# Patient Record
Sex: Male | Born: 1950 | Race: White | Hispanic: No | Marital: Single | Smoking: Current every day smoker
Health system: Southern US, Community
[De-identification: ages and names within clinical notes are randomized; demographics above are authoritative.]

## PROBLEM LIST (undated history)

## (undated) ENCOUNTER — Emergency Department: Discharge status: Absent without leave | Payer: Self-pay

## (undated) ENCOUNTER — Emergency Department: Payer: Self-pay

## (undated) DIAGNOSIS — K089 Disorder of teeth and supporting structures, unspecified: Secondary | ICD-10-CM

## (undated) DIAGNOSIS — F32A Depression, unspecified: Secondary | ICD-10-CM

## (undated) DIAGNOSIS — I10 Essential (primary) hypertension: Secondary | ICD-10-CM

## (undated) DIAGNOSIS — R339 Retention of urine, unspecified: Secondary | ICD-10-CM

## (undated) DIAGNOSIS — I639 Cerebral infarction, unspecified: Secondary | ICD-10-CM

## (undated) DIAGNOSIS — H6690 Otitis media, unspecified, unspecified ear: Secondary | ICD-10-CM

## (undated) DIAGNOSIS — M549 Dorsalgia, unspecified: Secondary | ICD-10-CM

## (undated) DIAGNOSIS — B009 Herpesviral infection, unspecified: Secondary | ICD-10-CM

## (undated) DIAGNOSIS — M5410 Radiculopathy, site unspecified: Secondary | ICD-10-CM

## (undated) DIAGNOSIS — F1021 Alcohol dependence, in remission: Secondary | ICD-10-CM

## (undated) DIAGNOSIS — K219 Gastro-esophageal reflux disease without esophagitis: Secondary | ICD-10-CM

## (undated) DIAGNOSIS — M4800 Spinal stenosis, site unspecified: Secondary | ICD-10-CM

## (undated) DIAGNOSIS — F329 Major depressive disorder, single episode, unspecified: Secondary | ICD-10-CM

## (undated) DIAGNOSIS — G8929 Other chronic pain: Secondary | ICD-10-CM

## (undated) HISTORY — DX: Spinal stenosis, site unspecified: M48.00

## (undated) HISTORY — DX: Gastro-esophageal reflux disease without esophagitis: K21.9

## (undated) HISTORY — DX: Major depressive disorder, single episode, unspecified: F32.9

## (undated) HISTORY — PX: OTHER SURGICAL HISTORY: SHX169

## (undated) HISTORY — DX: Dorsalgia, unspecified: M54.9

## (undated) HISTORY — DX: Retention of urine, unspecified: R33.9

## (undated) HISTORY — DX: Disorder of teeth and supporting structures, unspecified: K08.9

## (undated) HISTORY — DX: Alcohol dependence, in remission: F10.21

## (undated) HISTORY — DX: Other chronic pain: G89.29

## (undated) HISTORY — PX: VASECTOMY: SHX75

## (undated) HISTORY — DX: Herpesviral infection, unspecified: B00.9

## (undated) HISTORY — DX: Otitis media, unspecified, unspecified ear: H66.90

## (undated) HISTORY — DX: Radiculopathy, site unspecified: M54.10

## (undated) HISTORY — DX: Depression, unspecified: F32.A

---

## 2005-07-08 ENCOUNTER — Encounter: Payer: Self-pay | Admitting: Family Medicine

## 2006-09-22 LAB — CONVERTED CEMR LAB
ALT: 45 units/L
Albumin: 4.3 g/dL
CO2: 24 meq/L
Calcium: 8.9 mg/dL
Chloride: 105 meq/L
HDL: 43 mg/dL
LDL Cholesterol: 81 mg/dL
PSA: 0.06 ng/mL
Sodium: 142 meq/L
TSH: 2.6 microintl units/mL
Total Protein: 6.8 g/dL
Triglycerides: 195 mg/dL
WBC, blood: 6.3 10*3/uL

## 2007-02-10 ENCOUNTER — Ambulatory Visit: Payer: Self-pay | Admitting: Family Medicine

## 2007-02-10 DIAGNOSIS — M4800 Spinal stenosis, site unspecified: Secondary | ICD-10-CM

## 2007-02-10 DIAGNOSIS — F339 Major depressive disorder, recurrent, unspecified: Secondary | ICD-10-CM | POA: Insufficient documentation

## 2007-02-10 DIAGNOSIS — M47817 Spondylosis without myelopathy or radiculopathy, lumbosacral region: Secondary | ICD-10-CM

## 2007-02-10 DIAGNOSIS — R52 Pain, unspecified: Secondary | ICD-10-CM | POA: Insufficient documentation

## 2007-02-10 DIAGNOSIS — F329 Major depressive disorder, single episode, unspecified: Secondary | ICD-10-CM

## 2007-02-18 ENCOUNTER — Telehealth: Payer: Self-pay | Admitting: Family Medicine

## 2007-02-18 ENCOUNTER — Encounter: Payer: Self-pay | Admitting: Family Medicine

## 2007-04-07 ENCOUNTER — Encounter: Payer: Self-pay | Admitting: Family Medicine

## 2007-05-05 ENCOUNTER — Encounter: Payer: Self-pay | Admitting: Family Medicine

## 2007-05-25 ENCOUNTER — Encounter: Payer: Self-pay | Admitting: Family Medicine

## 2007-05-27 ENCOUNTER — Ambulatory Visit: Payer: Self-pay | Admitting: Family Medicine

## 2007-05-27 DIAGNOSIS — J310 Chronic rhinitis: Secondary | ICD-10-CM | POA: Insufficient documentation

## 2007-05-27 DIAGNOSIS — R609 Edema, unspecified: Secondary | ICD-10-CM | POA: Insufficient documentation

## 2007-05-27 LAB — CONVERTED CEMR LAB
Blood in Urine, dipstick: NEGATIVE
Nitrite: NEGATIVE
Specific Gravity, Urine: 1.02
WBC Urine, dipstick: NEGATIVE

## 2007-05-28 LAB — CONVERTED CEMR LAB
ALT: 18 units/L (ref 0–53)
AST: 25 units/L (ref 0–37)
Alkaline Phosphatase: 67 units/L (ref 39–117)
Basophils Absolute: 0 10*3/uL (ref 0.0–0.1)
Basophils Relative: 1 % (ref 0–1)
Chloride: 104 meq/L (ref 96–112)
Creatinine, Ser: 0.73 mg/dL (ref 0.40–1.50)
Eosinophils Relative: 1 % (ref 0–5)
Hemoglobin: 14 g/dL (ref 13.0–17.0)
MCHC: 32.9 g/dL (ref 30.0–36.0)
Monocytes Absolute: 0.5 10*3/uL (ref 0.1–1.0)
Neutro Abs: 3.1 10*3/uL (ref 1.7–7.7)
RDW: 13.9 % (ref 11.5–15.5)
Total Bilirubin: 0.4 mg/dL (ref 0.3–1.2)

## 2007-06-22 ENCOUNTER — Encounter: Payer: Self-pay | Admitting: Family Medicine

## 2007-06-23 ENCOUNTER — Encounter: Payer: Self-pay | Admitting: Family Medicine

## 2007-06-28 ENCOUNTER — Ambulatory Visit: Payer: Self-pay | Admitting: Family Medicine

## 2007-06-28 DIAGNOSIS — N401 Enlarged prostate with lower urinary tract symptoms: Secondary | ICD-10-CM

## 2007-06-29 ENCOUNTER — Encounter: Payer: Self-pay | Admitting: Family Medicine

## 2007-07-01 ENCOUNTER — Encounter: Payer: Self-pay | Admitting: Family Medicine

## 2007-07-26 ENCOUNTER — Encounter: Payer: Self-pay | Admitting: Family Medicine

## 2007-08-06 ENCOUNTER — Encounter: Payer: Self-pay | Admitting: Family Medicine

## 2007-09-16 ENCOUNTER — Ambulatory Visit: Payer: Self-pay | Admitting: Family Medicine

## 2007-09-16 DIAGNOSIS — K5900 Constipation, unspecified: Secondary | ICD-10-CM | POA: Insufficient documentation

## 2007-09-17 ENCOUNTER — Encounter: Payer: Self-pay | Admitting: Family Medicine

## 2007-09-17 DIAGNOSIS — M961 Postlaminectomy syndrome, not elsewhere classified: Secondary | ICD-10-CM

## 2007-09-22 ENCOUNTER — Telehealth (INDEPENDENT_AMBULATORY_CARE_PROVIDER_SITE_OTHER): Payer: Self-pay | Admitting: *Deleted

## 2007-10-07 ENCOUNTER — Ambulatory Visit: Payer: Self-pay | Admitting: Family Medicine

## 2007-10-07 DIAGNOSIS — R0602 Shortness of breath: Secondary | ICD-10-CM | POA: Insufficient documentation

## 2007-12-16 ENCOUNTER — Ambulatory Visit: Payer: Self-pay | Admitting: Family Medicine

## 2007-12-16 DIAGNOSIS — F17200 Nicotine dependence, unspecified, uncomplicated: Secondary | ICD-10-CM | POA: Insufficient documentation

## 2007-12-16 DIAGNOSIS — E785 Hyperlipidemia, unspecified: Secondary | ICD-10-CM | POA: Insufficient documentation

## 2007-12-16 LAB — CONVERTED CEMR LAB
Blood in Urine, dipstick: NEGATIVE
Nitrite: NEGATIVE
Urobilinogen, UA: 0.2
WBC Urine, dipstick: NEGATIVE

## 2007-12-17 ENCOUNTER — Encounter: Payer: Self-pay | Admitting: Family Medicine

## 2007-12-17 LAB — CONVERTED CEMR LAB
Albumin: 3.9 g/dL (ref 3.5–5.2)
BUN: 7 mg/dL (ref 6–23)
CO2: 25 meq/L (ref 19–32)
Calcium: 8.8 mg/dL (ref 8.4–10.5)
Chloride: 104 meq/L (ref 96–112)
Cholesterol: 167 mg/dL (ref 0–200)
Creatinine, Ser: 0.94 mg/dL (ref 0.40–1.50)
HDL: 46 mg/dL (ref >39)
PSA: 0.09 ng/mL — ABNORMAL LOW (ref 0.10–4.00)
Total CHOL/HDL Ratio: 3.6

## 2007-12-20 LAB — HM COLONOSCOPY

## 2007-12-27 ENCOUNTER — Encounter: Payer: Self-pay | Admitting: Family Medicine

## 2007-12-28 ENCOUNTER — Telehealth: Payer: Self-pay | Admitting: Family Medicine

## 2008-01-24 ENCOUNTER — Encounter: Payer: Self-pay | Admitting: Family Medicine

## 2008-02-24 ENCOUNTER — Ambulatory Visit: Payer: Self-pay | Admitting: Family Medicine

## 2008-02-28 ENCOUNTER — Telehealth: Payer: Self-pay | Admitting: Family Medicine

## 2008-04-13 ENCOUNTER — Ambulatory Visit: Payer: Self-pay | Admitting: Family Medicine

## 2008-04-13 DIAGNOSIS — L8991 Pressure ulcer of unspecified site, stage 1: Secondary | ICD-10-CM | POA: Insufficient documentation

## 2008-04-14 LAB — CONVERTED CEMR LAB
BUN: 8 mg/dL (ref 6–23)
Chloride: 101 meq/L (ref 96–112)
Glucose, Bld: 86 mg/dL (ref 70–99)
Potassium: 5 meq/L (ref 3.5–5.3)
Sodium: 138 meq/L (ref 135–145)

## 2008-04-19 ENCOUNTER — Telehealth: Payer: Self-pay | Admitting: Family Medicine

## 2008-04-24 ENCOUNTER — Telehealth (INDEPENDENT_AMBULATORY_CARE_PROVIDER_SITE_OTHER): Payer: Self-pay | Admitting: *Deleted

## 2008-04-29 ENCOUNTER — Encounter: Admission: RE | Admit: 2008-04-29 | Discharge: 2008-04-29 | Payer: Self-pay | Admitting: Family Medicine

## 2008-05-02 ENCOUNTER — Telehealth: Payer: Self-pay | Admitting: Family Medicine

## 2008-05-08 ENCOUNTER — Telehealth: Payer: Self-pay | Admitting: Family Medicine

## 2008-06-01 ENCOUNTER — Encounter: Payer: Self-pay | Admitting: Family Medicine

## 2008-06-12 ENCOUNTER — Ambulatory Visit: Payer: Self-pay | Admitting: Family Medicine

## 2008-06-19 ENCOUNTER — Encounter: Payer: Self-pay | Admitting: Family Medicine

## 2008-06-21 LAB — CONVERTED CEMR LAB
CO2: 23 meq/L (ref 19–32)
Calcium: 8.6 mg/dL (ref 8.4–10.5)
Chloride: 106 meq/L (ref 96–112)
Glucose, Bld: 94 mg/dL (ref 70–99)
HDL: 41 mg/dL (ref >39)
LDL Cholesterol: 137 mg/dL — ABNORMAL HIGH (ref 0–99)
Sodium: 141 meq/L (ref 135–145)
Total CHOL/HDL Ratio: 5.3
VLDL: 41 mg/dL — ABNORMAL HIGH (ref 0–40)

## 2008-08-31 ENCOUNTER — Ambulatory Visit: Payer: Self-pay | Admitting: Family Medicine

## 2008-12-08 ENCOUNTER — Ambulatory Visit: Payer: Self-pay | Admitting: Family Medicine

## 2008-12-19 ENCOUNTER — Ambulatory Visit: Payer: Self-pay | Admitting: Family Medicine

## 2009-02-01 ENCOUNTER — Telehealth (INDEPENDENT_AMBULATORY_CARE_PROVIDER_SITE_OTHER): Payer: Self-pay | Admitting: *Deleted

## 2009-05-08 ENCOUNTER — Telehealth: Payer: Self-pay | Admitting: Family Medicine

## 2010-02-11 ENCOUNTER — Ambulatory Visit: Payer: Self-pay | Admitting: Family Medicine

## 2010-03-21 ENCOUNTER — Telehealth (INDEPENDENT_AMBULATORY_CARE_PROVIDER_SITE_OTHER): Payer: Self-pay | Admitting: *Deleted

## 2010-04-16 NOTE — Letter (Signed)
Summary: Medical Examination for [facility]  Medical Examination for DMV   Imported By: Maryln Gottron 02/22/2010 13:24:24  _____________________________________________________________________  External Attachment:    Type:   Image     Comment:   External Document

## 2010-04-16 NOTE — Progress Notes (Signed)
Summary: Disability papers and gabapentin refills  Phone Note Refill Request   Refills Requested: Medication #1:  GABAPENTIN 600 MG  TABS take two tabs by mouth every eight hours as needed Pt also states that he is having a friend drop off papers for his disability.   Initial call taken by: Payton Spark CMA,  May 08, 2009 11:52 AM  Follow-up for Phone Call        I do not do long term disability. Pls let him know this. Follow-up by: Seymour Bars DO,  May 08, 2009 12:01 PM    Prescriptions: GABAPENTIN 600 MG  TABS (GABAPENTIN) take two tabs by mouth every eight hours as needed  #90 Tablet x 1   Entered and Authorized by:   Seymour Bars DO   Signed by:   Seymour Bars DO on 05/08/2009   Method used:   Electronically to        [facility]  [address] (retail)       [address]       [place], [place]  [address]       Ph: [phone]       Fax: [phone]   RxID:   [id number]   Appended Document: Disability papers and gabapentin refills Pt aware of the above. Pt states the papers are the renewal of the papers that you filled out in Aug.   Appended Document: Disability papers and gabapentin refills OK, will do the same.  Seymour Bars, D.O.
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
## 2010-04-18 NOTE — Progress Notes (Signed)
Summary: Omnaris refill       New/Updated Medications: OMNARIS 50 MCG/ACT SUSP (CICLESONIDE) 2 sprays each nostril daily Prescriptions: OMNARIS 50 MCG/ACT SUSP (CICLESONIDE) 2 sprays each nostril daily  #1 x 2   Entered by:   Payton Spark CMA   Authorized by:   Seymour Bars DO   Signed by:   Payton Spark CMA on 03/21/2010   Method used:   Electronically to        [facility]  [address] (retail)       [address]       [place], [place]  [address]       Ph: [phone]       Fax: [phone]   RxID:   [id number]

## 2010-06-03 ENCOUNTER — Telehealth: Payer: Self-pay | Admitting: Family Medicine

## 2010-06-04 ENCOUNTER — Encounter: Payer: Self-pay | Admitting: Family Medicine

## 2010-06-10 ENCOUNTER — Ambulatory Visit (INDEPENDENT_AMBULATORY_CARE_PROVIDER_SITE_OTHER): Payer: Medicare Other | Admitting: Family Medicine

## 2010-06-10 ENCOUNTER — Encounter: Payer: Self-pay | Admitting: Family Medicine

## 2010-06-10 DIAGNOSIS — L089 Local infection of the skin and subcutaneous tissue, unspecified: Secondary | ICD-10-CM | POA: Insufficient documentation

## 2010-06-10 DIAGNOSIS — E785 Hyperlipidemia, unspecified: Secondary | ICD-10-CM

## 2010-06-10 DIAGNOSIS — N400 Enlarged prostate without lower urinary tract symptoms: Secondary | ICD-10-CM

## 2010-06-10 DIAGNOSIS — Z1329 Encounter for screening for other suspected endocrine disorder: Secondary | ICD-10-CM

## 2010-06-10 DIAGNOSIS — IMO0002 Reserved for concepts with insufficient information to code with codable children: Secondary | ICD-10-CM

## 2010-06-10 DIAGNOSIS — R5383 Other fatigue: Secondary | ICD-10-CM

## 2010-06-10 DIAGNOSIS — K591 Functional diarrhea: Secondary | ICD-10-CM

## 2010-06-10 DIAGNOSIS — N401 Enlarged prostate with lower urinary tract symptoms: Secondary | ICD-10-CM

## 2010-06-10 MED ORDER — DIPHENOXYLATE-ATROPINE 2.5-0.025 MG PO TABS: 1.0000 | ORAL_TABLET | Freq: Four times a day (QID) | ORAL | Status: DC | PRN

## 2010-06-10 MED ORDER — CEPHALEXIN 500 MG PO CAPS: 500.0000 mg | ORAL_CAPSULE | Freq: Three times a day (TID) | ORAL | Status: AC

## 2010-06-10 NOTE — Progress Notes (Signed)
  Subjective:    Patient ID: John Scott, male    DOB: [birth year], 60 y.o.   MRN: [id number]  HPI 60 yo WM presents for f/u visit.  He is doing fairly well, seeing Dr Oneal Grout for chronic back pain.  Due for fasting labs.  [facility] and [facility] he will have his [facility]work done there.  He plans to request  A visit with a podiatrist thru the [place] for a toenails that he picked the distal margin off of today.  It is oozing blood but not painful.  Denies seeing any pus.  His toenail was thickened for years.  He requested an RX for Lomotil over the phone but I did not have this on his med list.  Apparently, with dietary changes, he has diarrhea from time to time and immodium is not enough.  Denies having constipation from use of his narcotics.  His last RX for 30 tabs lasted for about 6 mos.    Review of Systems  Constitutional: Negative for fever and fatigue.  Respiratory: Negative for cough and shortness of breath.   Cardiovascular: Positive for leg swelling. Negative for chest pain and palpitations.  Gastrointestinal: Negative for constipation.  Genitourinary: Negative for difficulty urinating.  Musculoskeletal: Positive for myalgias, back pain and gait problem.  Psychiatric/Behavioral: Positive for dysphoric mood.   BP 136/87  Pulse 77  Ht 6\' 2"  (1.88 m)  Wt 264 lb (119.75 kg)  BMI 33.90 kg/m2  SpO2 94%       Objective:   Physical Exam  Constitutional: He appears well-developed and well-nourished. No distress.  HENT:  Head: Normocephalic and atraumatic.  Cardiovascular: Normal rate, regular rhythm and normal heart sounds.   Pulmonary/Chest: Effort normal and breath sounds normal. No respiratory distress. He has no wheezes. He has no rales.  Skin:     Psychiatric: He has a normal mood and affect.          Assessment & Plan:

## 2010-06-10 NOTE — Patient Instructions (Addendum)
Soak big toe in warm soapy water once daily x 10 min and cover with polysporin ointment and gauze. F/U with podiatry through the [place].  Tetanus done in 09.  Take 7 days of Keflex and call if you develop increased pain, fever or pus.  Update fasting labs. Will call you w/ results.  Return for follow up in 4 mos.

## 2010-06-10 NOTE — Assessment & Plan Note (Signed)
Stable, followed by Dr Oneal Grout

## 2010-06-10 NOTE — Assessment & Plan Note (Signed)
Tetanus vaccine UTD. Wound care with warm water/ soap soaks, antibiotic ointment and a gauze dressing - change daily. Wound dressed today. Cover with 7 days of Kelfex and call if any sign of further infection. [facility] and will call me if any delays.

## 2010-06-13 NOTE — Progress Notes (Signed)
Summary: Requests Lomotil Rx  Phone Note Call from Patient   Caller: Patient Summary of Call: Pt requests refill on Lomotil for diarrhea and stomach cramps. Please advise. Initial call taken by: Payton Spark CMA,  June 03, 2010 4:35 PM  Follow-up for Phone Call        RX never prescribed here. He can call GI if that's where he got the last RX. Follow-up by: Seymour Bars DO,  June 03, 2010 4:54 PM     Appended Document: Requests Lomotil Rx Pt scheduled apt.

## 2010-08-06 ENCOUNTER — Encounter: Payer: Self-pay | Admitting: Family Medicine

## 2010-08-07 ENCOUNTER — Other Ambulatory Visit: Payer: Self-pay | Admitting: Family Medicine

## 2010-08-21 ENCOUNTER — Other Ambulatory Visit: Payer: Self-pay | Admitting: Family Medicine

## 2010-09-25 ENCOUNTER — Telehealth: Payer: Self-pay | Admitting: *Deleted

## 2010-09-25 NOTE — Telephone Encounter (Signed)
Pt states he needs a letter to get out of jury duty bc he is physically unable to go and sit. Please advise,

## 2010-09-27 NOTE — Telephone Encounter (Signed)
LMOM for the pt instructing him that Dr. Cathey Endow will not write a letter for him to get out of jury duty since he feels he is physically unable is not valid reason for not doing jury duty. Jarvis Newcomer, LPN [place]

## 2010-09-27 NOTE — Telephone Encounter (Signed)
This is not a valid reason to get out of jury duty.

## 2010-09-30 ENCOUNTER — Ambulatory Visit (INDEPENDENT_AMBULATORY_CARE_PROVIDER_SITE_OTHER): Payer: Medicare Other | Admitting: Family Medicine

## 2010-09-30 ENCOUNTER — Encounter: Payer: Self-pay | Admitting: Family Medicine

## 2010-09-30 NOTE — Progress Notes (Signed)
  Subjective:    Patient ID: John Scott, male    DOB: 10-Dec-1950, 60 y.o.   MRN: [id number]  HPI  I declined to see the pt today as Dr. Cathey Endow had already denied him getting out of jury duty. He is welcome to schedule an appt with her to discuss.   Review of Systems     Objective:   Physical Exam        Assessment & Plan:

## 2010-10-08 ENCOUNTER — Ambulatory Visit: Payer: Medicare Other | Admitting: Family Medicine

## 2010-10-21 ENCOUNTER — Other Ambulatory Visit: Payer: Self-pay | Admitting: Family Medicine

## 2011-02-14 ENCOUNTER — Other Ambulatory Visit: Payer: Self-pay | Admitting: *Deleted

## 2011-02-14 MED ORDER — DIPHENOXYLATE-ATROPINE 2.5-0.025 MG PO TABS: 1.0000 | ORAL_TABLET | Freq: Four times a day (QID) | ORAL | Status: AC | PRN

## 2011-04-22 ENCOUNTER — Other Ambulatory Visit: Payer: Self-pay | Admitting: *Deleted

## 2011-04-22 MED ORDER — FLUTICASONE PROPIONATE 50 MCG/ACT NA SUSP: 2.0000 | Freq: Every day | NASAL | Status: DC

## 2011-06-03 DIAGNOSIS — M47817 Spondylosis without myelopathy or radiculopathy, lumbosacral region: Secondary | ICD-10-CM | POA: Diagnosis not present

## 2011-06-03 DIAGNOSIS — M5137 Other intervertebral disc degeneration, lumbosacral region: Secondary | ICD-10-CM | POA: Diagnosis not present

## 2011-06-05 ENCOUNTER — Ambulatory Visit (INDEPENDENT_AMBULATORY_CARE_PROVIDER_SITE_OTHER): Payer: Medicare Other | Admitting: Family Medicine

## 2011-06-05 ENCOUNTER — Ambulatory Visit: Payer: Medicare Other | Admitting: Family Medicine

## 2011-06-05 ENCOUNTER — Ambulatory Visit
Admission: RE | Admit: 2011-06-05 | Discharge: 2011-06-05 | Disposition: A | Discharge status: Home / Self Care | Payer: Medicare Other | Source: Ambulatory Visit | Attending: Family Medicine | Admitting: Family Medicine

## 2011-06-05 ENCOUNTER — Encounter: Payer: Self-pay | Admitting: Family Medicine

## 2011-06-05 VITALS — BP 93/59 | HR 70 | Ht 74.0 in | Wt 273.0 lb

## 2011-06-05 DIAGNOSIS — M549 Dorsalgia, unspecified: Secondary | ICD-10-CM

## 2011-06-05 DIAGNOSIS — E785 Hyperlipidemia, unspecified: Secondary | ICD-10-CM | POA: Diagnosis not present

## 2011-06-05 DIAGNOSIS — R05 Cough: Secondary | ICD-10-CM

## 2011-06-05 MED ORDER — AMBULATORY NON FORMULARY MEDICATION: Status: DC

## 2011-06-05 MED ORDER — DIAZEPAM 10 MG PO TABS: 10.0000 mg | ORAL_TABLET | Freq: Three times a day (TID) | ORAL | Status: DC | PRN

## 2011-06-05 NOTE — Patient Instructions (Signed)
[facility] about getting [facility].
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
## 2011-06-12 LAB — COMPLETE METABOLIC PANEL WITH GFR
ALT: 14 U/L (ref 0–53)
AST: 17 U/L (ref 0–37)
Alkaline Phosphatase: 90 U/L (ref 39–117)
CO2: 27 mEq/L (ref 19–32)
Creat: 0.98 mg/dL (ref 0.50–1.35)
Sodium: 138 mEq/L (ref 135–145)
Total Bilirubin: 0.3 mg/dL (ref 0.3–1.2)
Total Protein: 6.6 g/dL (ref 6.0–8.3)

## 2011-06-12 LAB — LIPID PANEL
HDL: 39 mg/dL — ABNORMAL LOW (ref >39)
Total CHOL/HDL Ratio: 6.1 Ratio
Triglycerides: 453 mg/dL — ABNORMAL HIGH (ref <150)

## 2011-07-22 ENCOUNTER — Other Ambulatory Visit: Payer: Self-pay | Admitting: Family Medicine

## 2011-07-30 DIAGNOSIS — M790 Rheumatism, unspecified: Secondary | ICD-10-CM | POA: Diagnosis not present

## 2011-07-30 DIAGNOSIS — M797 Fibromyalgia: Secondary | ICD-10-CM | POA: Diagnosis not present

## 2011-07-30 DIAGNOSIS — IMO0002 Reserved for concepts with insufficient information to code with codable children: Secondary | ICD-10-CM | POA: Diagnosis not present

## 2011-07-30 DIAGNOSIS — M5137 Other intervertebral disc degeneration, lumbosacral region: Secondary | ICD-10-CM | POA: Diagnosis not present

## 2011-07-30 DIAGNOSIS — Z79899 Other long term (current) drug therapy: Secondary | ICD-10-CM | POA: Diagnosis not present

## 2011-07-30 DIAGNOSIS — M961 Postlaminectomy syndrome, not elsewhere classified: Secondary | ICD-10-CM | POA: Diagnosis not present

## 2011-08-12 ENCOUNTER — Other Ambulatory Visit: Payer: Self-pay | Admitting: *Deleted

## 2011-08-12 MED ORDER — FLUTICASONE PROPIONATE 50 MCG/ACT NA SUSP: 2.0000 | Freq: Every day | NASAL | Status: DC

## 2011-08-19 ENCOUNTER — Other Ambulatory Visit: Payer: Self-pay | Admitting: Family Medicine

## 2011-09-11 ENCOUNTER — Other Ambulatory Visit: Payer: Self-pay | Admitting: Family Medicine

## 2011-09-30 DIAGNOSIS — M790 Rheumatism, unspecified: Secondary | ICD-10-CM | POA: Diagnosis not present

## 2011-10-20 ENCOUNTER — Encounter: Payer: Self-pay | Admitting: Family Medicine

## 2011-10-20 ENCOUNTER — Ambulatory Visit (INDEPENDENT_AMBULATORY_CARE_PROVIDER_SITE_OTHER): Payer: Medicare Other | Admitting: Family Medicine

## 2011-10-20 VITALS — BP 108/73 | HR 55 | Wt 267.0 lb

## 2011-10-20 DIAGNOSIS — R0981 Nasal congestion: Secondary | ICD-10-CM | POA: Insufficient documentation

## 2011-10-20 DIAGNOSIS — M461 Sacroiliitis, not elsewhere classified: Secondary | ICD-10-CM | POA: Diagnosis not present

## 2011-10-20 DIAGNOSIS — M129 Arthropathy, unspecified: Secondary | ICD-10-CM | POA: Diagnosis not present

## 2011-10-20 DIAGNOSIS — IMO0001 Reserved for inherently not codable concepts without codable children: Secondary | ICD-10-CM | POA: Diagnosis not present

## 2011-10-20 DIAGNOSIS — M5137 Other intervertebral disc degeneration, lumbosacral region: Secondary | ICD-10-CM | POA: Diagnosis not present

## 2011-10-20 DIAGNOSIS — M47817 Spondylosis without myelopathy or radiculopathy, lumbosacral region: Secondary | ICD-10-CM | POA: Diagnosis not present

## 2011-10-20 DIAGNOSIS — L0390 Cellulitis, unspecified: Secondary | ICD-10-CM

## 2011-10-20 DIAGNOSIS — L0291 Cutaneous abscess, unspecified: Secondary | ICD-10-CM

## 2011-10-20 DIAGNOSIS — M961 Postlaminectomy syndrome, not elsewhere classified: Secondary | ICD-10-CM | POA: Diagnosis not present

## 2011-10-20 DIAGNOSIS — J3489 Other specified disorders of nose and nasal sinuses: Secondary | ICD-10-CM

## 2011-10-20 DIAGNOSIS — IMO0002 Reserved for concepts with insufficient information to code with codable children: Secondary | ICD-10-CM

## 2011-10-20 DIAGNOSIS — L739 Follicular disorder, unspecified: Secondary | ICD-10-CM | POA: Insufficient documentation

## 2011-10-20 MED ORDER — CHLORPHENIRAMINE-PSEUDOEPH 4-60 MG PO TABS: 1.0000 | ORAL_TABLET | Freq: Two times a day (BID) | ORAL | Status: DC | PRN

## 2011-10-20 NOTE — Patient Instructions (Addendum)
Incision and Drainage of Abscess An abscess (boil or furuncle) is an area infected by germs that contains a collection of pus. Signs and problems (symptoms) of an abscess include pain, tenderness, redness, or hardness. You may feel a moveable, soft area under your skin. An abscess can occur anywhere in the body. Occasionally, this may spread to surrounding tissues causing cellulitis. Sometimes, a surgeon may make a cut (incision) over your abscess. The pus is drained. Gauze may be packed into the space to provide a drain. Keeping a drain or piece of gauze in the incision keeps the skin from healing first. This helps stop the abscess from forming again. The area may be painful for 5 to 7 days. Most people with an abscess do not have high fevers. If seen early, your abscess may not have localized and may not be cut. If it does not get better on its own or with medicines, you may require another appointment. HOME CARE INSTRUCTIONS   Use a warm-moist compress applied to the site of the incised boil four times a day for five days, this will encourage drainage and should prevent the reoccurrence of a boil.  Return ASAP if you develop fevers, chills, or if the boil appears to have reappeared.   Only take over-the-counter or prescription medicines for pain, discomfort, or fever as directed by your caregiver. Use these only if your caregiver has not given medicines that would interfere.   When you bathe, remove the gauze drain after soaking. You may then wash the wound gently with mild, soapy water.   See your caregiver as directed for a recheck if not improving.  If antibiotics were prescribed, take them as directed.  SEEK MEDICAL CARE IF:   You develop increased pain, swelling, redness, drainage, or bleeding in the wound site.   You develop signs of generalized infection, including muscle aches, chills, or a general ill feeling.   You or your child has an oral temperature above 102 F (38.9 C).  MAKE  SURE YOU:   Understand these instructions.   Will watch your condition.   Will get help right away if you are not doing well or get worse.  Document Released: 08/27/2000 Document Revised: 11/13/2010 Document Reviewed: 10/22/2007 [facility] Patient Information 2012 [place], [place].
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
## 2011-10-21 ENCOUNTER — Ambulatory Visit: Payer: Medicare Other | Admitting: Sports Medicine

## 2011-10-21 ENCOUNTER — Telehealth: Payer: Self-pay | Admitting: Family Medicine

## 2011-10-21 MED ORDER — PSEUDOEPHEDRINE HCL 60 MG PO TABS: ORAL_TABLET | ORAL | Status: DC

## 2011-10-21 MED ORDER — CHLORPHENIRAMINE MALEATE 4 MG PO TABS: ORAL_TABLET | ORAL | Status: DC

## 2011-10-21 NOTE — Telephone Encounter (Signed)
Received fax from [facility], unable to fill chlrpheniramine-pseudophedrine 4-60mg  but able to fill individual medications at same doses.  Updated med list and Rx printed and given to [facility] for faxing to pharmacy.

## 2011-10-29 ENCOUNTER — Encounter: Payer: Self-pay | Admitting: Family Medicine

## 2011-10-29 ENCOUNTER — Ambulatory Visit (INDEPENDENT_AMBULATORY_CARE_PROVIDER_SITE_OTHER): Payer: Medicare Other | Admitting: Family Medicine

## 2011-10-29 VITALS — BP 139/83 | HR 95 | Temp 97.4°F | Wt 263.0 lb

## 2011-10-29 DIAGNOSIS — H60399 Other infective otitis externa, unspecified ear: Secondary | ICD-10-CM

## 2011-10-29 DIAGNOSIS — H6091 Unspecified otitis externa, right ear: Secondary | ICD-10-CM

## 2011-10-29 MED ORDER — CIPROFLOXACIN-DEXAMETHASONE 0.3-0.1 % OT SUSP: 4.0000 [drp] | Freq: Two times a day (BID) | OTIC | Status: AC
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
## 2011-10-29 NOTE — Patient Instructions (Signed)
Otitis Externa  Otitis externa ("swimmer's ear") is a germ (bacterial) or fungal infection of the outer ear canal (from the eardrum to the outside of the ear). Swimming in dirty water may cause swimmer's ear. It also may be caused by moisture in the ear from water remaining after swimming or bathing. Often the first signs of infection may be itching in the ear canal. This may progress to ear canal swelling, redness, and pus drainage, which may be signs of infection.  HOME CARE INSTRUCTIONS    Apply the antibiotic drops to the ear canal as prescribed by your doctor.   This can be a very painful medical condition. A strong pain reliever may be prescribed.   Only take over-the-counter or prescription medicines for pain, discomfort, or fever as directed by your caregiver.   If your caregiver has given you a follow-up appointment, it is very important to keep that appointment. Not keeping the appointment could result in a chronic or permanent injury, pain, hearing loss and disability. If there is any problem keeping the appointment, you must call back to this facility for assistance.  PREVENTION    It is important to keep your ear dry. Use the corner of a towel to wick water out of the ear canal after swimming or bathing.   Avoid scratching in your ear. This can damage the ear canal or remove the protective wax lining the canal and make it easier for germs (bacteria) or a fungus to grow.   You may use ear drops made of rubbing alcohol and vinegar after swimming to prevent future "swimmer's ear" infections. Make up a small bottle of equal parts white vinegar and alcohol. Put 3 or 4 drops into each ear after swimming.   Avoid swimming in lakes, polluted water, or poorly chlorinated pools.  SEEK MEDICAL CARE IF:    An oral temperature above 102 F (38.9 C) develops.   Your ear is still painful after 3 days and shows signs of getting worse (redness, swelling, pain, or pus).  MAKE SURE YOU:    Understand these  instructions.   Will watch your condition.   Will get help right away if you are not doing well or get worse.  Document Released: 03/03/2005 Document Revised: 02/20/2011 Document Reviewed: 10/08/2007  [employer] Patient Information 2012 [employer].

## 2011-11-01 ENCOUNTER — Emergency Department (INDEPENDENT_AMBULATORY_CARE_PROVIDER_SITE_OTHER)
Admission: EM | Admit: 2011-11-01 | Discharge: 2011-11-01 | Disposition: A | Discharge status: Home / Self Care | Payer: Medicare Other | Source: Home / Self Care | Attending: Family Medicine | Admitting: Family Medicine

## 2011-11-01 DIAGNOSIS — H9203 Otalgia, bilateral: Secondary | ICD-10-CM

## 2011-11-01 DIAGNOSIS — M2669 Other specified disorders of temporomandibular joint: Secondary | ICD-10-CM

## 2011-11-01 DIAGNOSIS — H6690 Otitis media, unspecified, unspecified ear: Secondary | ICD-10-CM

## 2011-11-01 DIAGNOSIS — M26629 Arthralgia of temporomandibular joint, unspecified side: Secondary | ICD-10-CM

## 2011-11-01 DIAGNOSIS — H9209 Otalgia, unspecified ear: Secondary | ICD-10-CM

## 2011-11-01 DIAGNOSIS — H6692 Otitis media, unspecified, left ear: Secondary | ICD-10-CM

## 2011-11-01 MED ORDER — PREDNISONE 20 MG PO TABS: 20.0000 mg | ORAL_TABLET | Freq: Two times a day (BID) | ORAL | Status: AC

## 2011-11-01 MED ORDER — ANTIPYRINE-BENZOCAINE 5.4-1.4 % OT SOLN: 3.0000 [drp] | Freq: Four times a day (QID) | OTIC | Status: AC | PRN

## 2011-11-01 MED ORDER — ANTIPYRINE-BENZOCAINE 5.4-1.4 % OT SOLN
3.0000 [drp] | Freq: Once | OTIC | Status: AC
  Administered 2011-11-01: 3 [drp] via OTIC

## 2011-11-01 MED ORDER — AMOXICILLIN 875 MG PO TABS: 875.0000 mg | ORAL_TABLET | Freq: Two times a day (BID) | ORAL | Status: AC
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
## 2011-11-01 NOTE — ED Notes (Signed)
John Scott was seen on the 14 th by Dr Dorothe Pea for bilateral ear pain. He states he has had no relief for the ear pain even after taking the antibiotic drops, sudafed and benadryl.

## 2011-11-06 ENCOUNTER — Other Ambulatory Visit: Payer: Self-pay | Admitting: Family Medicine

## 2011-11-06 DIAGNOSIS — M545 Low back pain: Secondary | ICD-10-CM | POA: Diagnosis not present

## 2011-11-06 DIAGNOSIS — Z5181 Encounter for therapeutic drug level monitoring: Secondary | ICD-10-CM | POA: Diagnosis not present

## 2011-11-06 DIAGNOSIS — IMO0002 Reserved for concepts with insufficient information to code with codable children: Secondary | ICD-10-CM | POA: Diagnosis not present

## 2011-11-07 ENCOUNTER — Encounter: Payer: Self-pay | Admitting: Emergency Medicine

## 2011-11-12 DIAGNOSIS — Z23 Encounter for immunization: Secondary | ICD-10-CM | POA: Diagnosis not present

## 2011-11-18 ENCOUNTER — Telehealth: Payer: Self-pay | Admitting: *Deleted

## 2011-11-18 DIAGNOSIS — H9209 Otalgia, unspecified ear: Secondary | ICD-10-CM

## 2011-11-18 DIAGNOSIS — R42 Dizziness and giddiness: Secondary | ICD-10-CM

## 2011-11-18 DIAGNOSIS — IMO0002 Reserved for concepts with insufficient information to code with codable children: Secondary | ICD-10-CM | POA: Diagnosis not present

## 2011-11-18 DIAGNOSIS — M545 Low back pain: Secondary | ICD-10-CM | POA: Diagnosis not present

## 2011-11-18 NOTE — Telephone Encounter (Signed)
Referral has now been placed.

## 2011-11-18 NOTE — Telephone Encounter (Signed)
Pt states he is feeling some better but is still having problems with equalization and would like to be referred to an ENT.

## 2011-12-10 ENCOUNTER — Encounter: Payer: Self-pay | Admitting: Family Medicine

## 2011-12-10 ENCOUNTER — Ambulatory Visit (INDEPENDENT_AMBULATORY_CARE_PROVIDER_SITE_OTHER): Payer: Medicare Other | Admitting: Family Medicine

## 2011-12-10 ENCOUNTER — Telehealth: Payer: Self-pay | Admitting: Family Medicine

## 2011-12-10 VITALS — BP 137/84 | HR 73 | Temp 98.2°F | Wt 259.0 lb

## 2011-12-10 DIAGNOSIS — M4800 Spinal stenosis, site unspecified: Secondary | ICD-10-CM

## 2011-12-10 DIAGNOSIS — H9209 Otalgia, unspecified ear: Secondary | ICD-10-CM

## 2011-12-10 DIAGNOSIS — M47817 Spondylosis without myelopathy or radiculopathy, lumbosacral region: Secondary | ICD-10-CM

## 2011-12-10 DIAGNOSIS — H6691 Otitis media, unspecified, right ear: Secondary | ICD-10-CM

## 2011-12-10 DIAGNOSIS — H6690 Otitis media, unspecified, unspecified ear: Secondary | ICD-10-CM

## 2011-12-10 DIAGNOSIS — IMO0002 Reserved for concepts with insufficient information to code with codable children: Secondary | ICD-10-CM

## 2011-12-10 MED ORDER — AMOXICILLIN-POT CLAVULANATE 500-125 MG PO TABS: ORAL_TABLET | ORAL | Status: AC
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
## 2011-12-10 NOTE — Telephone Encounter (Signed)
Patient was seen TODAY on Dr. Shelah Lewandowsky half day and he states that he would really like to switch his pcp to Dr. Ivan Anchors and that he likes Dr. Linford Arnold but he would like to continue to see Dr. Ivan Anchors and likes the fact that he is a man. Thanks and please let me know this is okay and I will change his pcp to Dr. Ivan Anchors. ~ Victorino Dike

## 2011-12-10 NOTE — Telephone Encounter (Signed)
Ok to switch, I changed PCP in header.

## 2011-12-10 NOTE — Telephone Encounter (Signed)
You're right, I am in fact a man.  This is fine with me only if it's ok with Dr. Judie Petit.

## 2011-12-12 ENCOUNTER — Encounter: Payer: Self-pay | Admitting: Family Medicine

## 2011-12-12 ENCOUNTER — Ambulatory Visit (INDEPENDENT_AMBULATORY_CARE_PROVIDER_SITE_OTHER): Payer: Medicare Other | Admitting: Family Medicine

## 2011-12-12 VITALS — BP 150/96 | HR 72 | Wt 271.0 lb

## 2011-12-12 DIAGNOSIS — M79609 Pain in unspecified limb: Secondary | ICD-10-CM

## 2011-12-12 DIAGNOSIS — M79605 Pain in left leg: Secondary | ICD-10-CM | POA: Insufficient documentation

## 2011-12-12 DIAGNOSIS — IMO0002 Reserved for concepts with insufficient information to code with codable children: Secondary | ICD-10-CM

## 2011-12-12 DIAGNOSIS — R29898 Other symptoms and signs involving the musculoskeletal system: Secondary | ICD-10-CM | POA: Insufficient documentation
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
## 2011-12-16 ENCOUNTER — Telehealth: Payer: Self-pay | Admitting: Family Medicine

## 2011-12-16 DIAGNOSIS — IMO0002 Reserved for concepts with insufficient information to code with codable children: Secondary | ICD-10-CM | POA: Diagnosis not present

## 2011-12-16 DIAGNOSIS — M545 Low back pain: Secondary | ICD-10-CM | POA: Diagnosis not present

## 2011-12-16 NOTE — Telephone Encounter (Signed)
Tammy, Will you please let Mr Popwell know that I've completed the physician statement section and even made a copy of his VA Disability paperwork to add to our medical records.  I've placed his [employer] Benefits paperwork up in the front Engineer, [employer].  He may pick up these papers at his convenience, if he'd prefer the front desk to fax or mail these off I'm ok with that too, I think he even gave [place] some stamps.  Thank you. Gregary Signs

## 2011-12-19 NOTE — Telephone Encounter (Signed)
Documents were faxed by Sue Lush.

## 2011-12-25 ENCOUNTER — Other Ambulatory Visit: Payer: Self-pay | Admitting: *Deleted

## 2011-12-25 MED ORDER — FLUOXETINE HCL 20 MG PO CAPS: 20.0000 mg | ORAL_CAPSULE | Freq: Two times a day (BID) | ORAL | Status: DC

## 2011-12-26 DIAGNOSIS — J309 Allergic rhinitis, unspecified: Secondary | ICD-10-CM | POA: Diagnosis not present

## 2011-12-30 DIAGNOSIS — M47817 Spondylosis without myelopathy or radiculopathy, lumbosacral region: Secondary | ICD-10-CM | POA: Diagnosis not present

## 2011-12-30 DIAGNOSIS — IMO0002 Reserved for concepts with insufficient information to code with codable children: Secondary | ICD-10-CM | POA: Diagnosis not present

## 2011-12-30 DIAGNOSIS — M5137 Other intervertebral disc degeneration, lumbosacral region: Secondary | ICD-10-CM | POA: Diagnosis not present

## 2011-12-30 DIAGNOSIS — IMO0001 Reserved for inherently not codable concepts without codable children: Secondary | ICD-10-CM | POA: Diagnosis not present

## 2012-01-05 ENCOUNTER — Telehealth: Payer: Self-pay

## 2012-01-05 NOTE — Telephone Encounter (Signed)
[name] is waiting on his pain management referral.

## 2012-01-05 NOTE — Telephone Encounter (Signed)
[place], I see that John [place]'s information from my referral 9/25 referral has already been faxed.  Would you be able to see if [facility] has arranged anything for him yet?  Thanks. Gregary Signs

## 2012-01-27 DIAGNOSIS — E860 Dehydration: Secondary | ICD-10-CM | POA: Diagnosis not present

## 2012-01-27 DIAGNOSIS — M5137 Other intervertebral disc degeneration, lumbosacral region: Secondary | ICD-10-CM | POA: Diagnosis not present

## 2012-01-27 DIAGNOSIS — IMO0002 Reserved for concepts with insufficient information to code with codable children: Secondary | ICD-10-CM | POA: Diagnosis not present

## 2012-01-27 DIAGNOSIS — M47817 Spondylosis without myelopathy or radiculopathy, lumbosacral region: Secondary | ICD-10-CM | POA: Diagnosis not present

## 2012-02-05 DIAGNOSIS — M5137 Other intervertebral disc degeneration, lumbosacral region: Secondary | ICD-10-CM | POA: Diagnosis not present

## 2012-02-05 DIAGNOSIS — M47817 Spondylosis without myelopathy or radiculopathy, lumbosacral region: Secondary | ICD-10-CM | POA: Diagnosis not present

## 2012-02-10 ENCOUNTER — Other Ambulatory Visit: Payer: Self-pay | Admitting: Family Medicine

## 2012-02-11 ENCOUNTER — Other Ambulatory Visit: Payer: Self-pay | Admitting: Family Medicine

## 2012-02-24 DIAGNOSIS — G8929 Other chronic pain: Secondary | ICD-10-CM | POA: Diagnosis not present

## 2012-02-24 DIAGNOSIS — M961 Postlaminectomy syndrome, not elsewhere classified: Secondary | ICD-10-CM | POA: Diagnosis not present

## 2012-02-24 DIAGNOSIS — M539 Dorsopathy, unspecified: Secondary | ICD-10-CM | POA: Diagnosis not present

## 2012-02-24 DIAGNOSIS — Z5181 Encounter for therapeutic drug level monitoring: Secondary | ICD-10-CM | POA: Diagnosis not present

## 2012-02-24 DIAGNOSIS — M545 Low back pain: Secondary | ICD-10-CM | POA: Diagnosis not present

## 2012-03-23 ENCOUNTER — Other Ambulatory Visit: Payer: Self-pay | Admitting: *Deleted

## 2012-03-23 ENCOUNTER — Other Ambulatory Visit: Payer: Self-pay | Admitting: Family Medicine

## 2012-03-23 DIAGNOSIS — M961 Postlaminectomy syndrome, not elsewhere classified: Secondary | ICD-10-CM | POA: Diagnosis not present

## 2012-03-23 MED ORDER — DIAZEPAM 10 MG PO TABS: 10.0000 mg | ORAL_TABLET | Freq: Three times a day (TID) | ORAL | Status: DC | PRN

## 2012-03-23 MED ORDER — GABAPENTIN 600 MG PO TABS: 600.0000 mg | ORAL_TABLET | Freq: Every day | ORAL | Status: DC

## 2012-05-17 DIAGNOSIS — M545 Low back pain: Secondary | ICD-10-CM | POA: Diagnosis not present

## 2012-05-17 DIAGNOSIS — IMO0002 Reserved for concepts with insufficient information to code with codable children: Secondary | ICD-10-CM | POA: Diagnosis not present

## 2012-05-17 DIAGNOSIS — Z79899 Other long term (current) drug therapy: Secondary | ICD-10-CM | POA: Diagnosis not present

## 2012-05-24 DIAGNOSIS — M545 Low back pain: Secondary | ICD-10-CM | POA: Diagnosis not present

## 2012-05-24 DIAGNOSIS — M539 Dorsopathy, unspecified: Secondary | ICD-10-CM | POA: Diagnosis not present

## 2012-06-07 ENCOUNTER — Telehealth: Payer: Self-pay | Admitting: *Deleted

## 2012-06-07 NOTE — Telephone Encounter (Signed)
Pt notified . Form placed upfront for pick up.

## 2012-06-07 NOTE — Telephone Encounter (Signed)
Patient calls and states he faxed you a form that he needs filled out for DM[employer] handicap placard. States needs this for permanent disability due to spinal cord injury.Call him when ready

## 2012-06-07 NOTE — Telephone Encounter (Signed)
Sue Lush, Placed in you inbox, ready for faxing/pickup

## 2012-06-18 DIAGNOSIS — M48061 Spinal stenosis, lumbar region without neurogenic claudication: Secondary | ICD-10-CM | POA: Diagnosis not present

## 2012-06-18 DIAGNOSIS — M545 Low back pain: Secondary | ICD-10-CM | POA: Diagnosis not present

## 2012-06-18 DIAGNOSIS — M961 Postlaminectomy syndrome, not elsewhere classified: Secondary | ICD-10-CM | POA: Diagnosis not present

## 2012-06-18 DIAGNOSIS — M539 Dorsopathy, unspecified: Secondary | ICD-10-CM | POA: Diagnosis not present

## 2012-07-05 ENCOUNTER — Encounter: Payer: Self-pay | Admitting: *Deleted

## 2012-07-06 ENCOUNTER — Telehealth: Payer: Self-pay | Admitting: Family Medicine

## 2012-07-06 ENCOUNTER — Encounter: Payer: Self-pay | Admitting: Family Medicine

## 2012-07-06 ENCOUNTER — Ambulatory Visit (INDEPENDENT_AMBULATORY_CARE_PROVIDER_SITE_OTHER): Payer: Medicare Other | Admitting: Family Medicine

## 2012-07-06 VITALS — BP 109/76 | HR 77 | Ht 74.0 in | Wt 260.0 lb

## 2012-07-06 DIAGNOSIS — J302 Other seasonal allergic rhinitis: Secondary | ICD-10-CM

## 2012-07-06 DIAGNOSIS — M5416 Radiculopathy, lumbar region: Secondary | ICD-10-CM

## 2012-07-06 DIAGNOSIS — IMO0002 Reserved for concepts with insufficient information to code with codable children: Secondary | ICD-10-CM

## 2012-07-06 DIAGNOSIS — R29898 Other symptoms and signs involving the musculoskeletal system: Secondary | ICD-10-CM | POA: Diagnosis not present

## 2012-07-06 DIAGNOSIS — J309 Allergic rhinitis, unspecified: Secondary | ICD-10-CM | POA: Diagnosis not present

## 2012-07-06 MED ORDER — PREDNISONE 20 MG PO TABS: ORAL_TABLET | ORAL | Status: AC

## 2012-07-06 MED ORDER — FLUTICASONE PROPIONATE 50 MCG/ACT NA SUSP: NASAL | Status: DC

## 2012-07-06 NOTE — Telephone Encounter (Signed)
Sue Lush, Can you please call [facility] [phone] and see if Mr. Route has truly been asked to leave their practice or if there is a misunderstanding.  He believes he may have been fired by them but is not sure about the specifics.
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
## 2012-07-07 ENCOUNTER — Telehealth: Payer: Self-pay | Admitting: *Deleted

## 2012-07-07 NOTE — Telephone Encounter (Signed)
John Scott from medical records called and states she doesn't see anything noted in his chart saying that he was dismissed from their practice

## 2012-07-07 NOTE — Telephone Encounter (Signed)
Pt wanted Dr. Ivan Anchors to call him so that he could explain why he was so functional yesterday at his office visit. Pt states usually he has to stay flat on his back to avoid pain

## 2012-07-07 NOTE — Telephone Encounter (Signed)
I will send him a letter with the good news.

## 2012-07-07 NOTE — Telephone Encounter (Signed)
Spoke with Melissa at [facility] and she didn't see anything about pt being dismissed in his chart. Transferred me to med rec and I left a message for them to call me back

## 2012-07-07 NOTE — Telephone Encounter (Signed)
Left message to call back  

## 2012-07-16 DIAGNOSIS — G894 Chronic pain syndrome: Secondary | ICD-10-CM | POA: Diagnosis not present

## 2012-07-16 DIAGNOSIS — IMO0002 Reserved for concepts with insufficient information to code with codable children: Secondary | ICD-10-CM | POA: Diagnosis not present

## 2012-07-16 DIAGNOSIS — M5137 Other intervertebral disc degeneration, lumbosacral region: Secondary | ICD-10-CM | POA: Diagnosis not present

## 2012-08-06 DIAGNOSIS — M5137 Other intervertebral disc degeneration, lumbosacral region: Secondary | ICD-10-CM | POA: Diagnosis not present

## 2012-08-06 DIAGNOSIS — G894 Chronic pain syndrome: Secondary | ICD-10-CM | POA: Diagnosis not present

## 2012-08-06 DIAGNOSIS — IMO0002 Reserved for concepts with insufficient information to code with codable children: Secondary | ICD-10-CM | POA: Diagnosis not present

## 2012-08-06 DIAGNOSIS — M545 Low back pain: Secondary | ICD-10-CM | POA: Diagnosis not present

## 2012-08-13 ENCOUNTER — Telehealth: Payer: Self-pay | Admitting: Family Medicine

## 2012-08-13 MED ORDER — DIAZEPAM 10 MG PO TABS: ORAL_TABLET | ORAL | Status: DC

## 2012-08-13 NOTE — Telephone Encounter (Signed)
Diazepam request, last fill back in January

## 2012-08-13 NOTE — Telephone Encounter (Signed)
Patient called and advised that he did not receive his refill at [facility] in [place]. Req a call back about med refill-thanks

## 2012-08-16 ENCOUNTER — Telehealth: Payer: Self-pay

## 2012-08-16 DIAGNOSIS — M4800 Spinal stenosis, site unspecified: Secondary | ICD-10-CM

## 2012-08-16 DIAGNOSIS — IMO0002 Reserved for concepts with insufficient information to code with codable children: Secondary | ICD-10-CM

## 2012-08-16 NOTE — Telephone Encounter (Signed)
John Scott would like a different pain management center. One that can give epidural injections.

## 2012-08-16 NOTE — Telephone Encounter (Signed)
Called pharm and pt has already picked up medication. He picked it up on 5/30 the day that it was sent

## 2012-08-18 ENCOUNTER — Other Ambulatory Visit: Payer: Self-pay | Admitting: Family Medicine

## 2012-08-18 DIAGNOSIS — M62838 Other muscle spasm: Secondary | ICD-10-CM

## 2012-08-23 NOTE — Telephone Encounter (Signed)
Andrea/Angela, Referral to [facility] pain management with Dr. Jess Barters has been placed.

## 2012-08-23 NOTE — Telephone Encounter (Signed)
Patient advised.

## 2012-08-23 NOTE — Addendum Note (Signed)
Addended by: Laren Boom on: 08/23/2012 09:57 AM   Modules accepted: Orders

## 2012-08-25 DIAGNOSIS — M545 Low back pain: Secondary | ICD-10-CM | POA: Diagnosis not present

## 2012-08-25 DIAGNOSIS — Z5181 Encounter for therapeutic drug level monitoring: Secondary | ICD-10-CM | POA: Diagnosis not present

## 2012-08-25 DIAGNOSIS — M5137 Other intervertebral disc degeneration, lumbosacral region: Secondary | ICD-10-CM | POA: Diagnosis not present

## 2012-08-25 DIAGNOSIS — Z79899 Other long term (current) drug therapy: Secondary | ICD-10-CM | POA: Diagnosis not present

## 2012-08-25 DIAGNOSIS — G894 Chronic pain syndrome: Secondary | ICD-10-CM | POA: Diagnosis not present

## 2012-08-25 DIAGNOSIS — IMO0002 Reserved for concepts with insufficient information to code with codable children: Secondary | ICD-10-CM | POA: Diagnosis not present

## 2012-09-28 ENCOUNTER — Other Ambulatory Visit: Payer: Self-pay | Admitting: *Deleted

## 2012-09-28 DIAGNOSIS — J302 Other seasonal allergic rhinitis: Secondary | ICD-10-CM

## 2012-09-28 MED ORDER — FLUTICASONE PROPIONATE 50 MCG/ACT NA SUSP: NASAL | Status: DC

## 2012-10-01 ENCOUNTER — Ambulatory Visit (INDEPENDENT_AMBULATORY_CARE_PROVIDER_SITE_OTHER): Payer: Medicare Other | Admitting: Family Medicine

## 2012-10-01 ENCOUNTER — Encounter: Payer: Self-pay | Admitting: Family Medicine

## 2012-10-01 ENCOUNTER — Telehealth: Payer: Self-pay | Admitting: Family Medicine

## 2012-10-01 VITALS — BP 130/79 | HR 78 | Wt 261.0 lb

## 2012-10-01 DIAGNOSIS — IMO0002 Reserved for concepts with insufficient information to code with codable children: Secondary | ICD-10-CM

## 2012-10-01 DIAGNOSIS — R259 Unspecified abnormal involuntary movements: Secondary | ICD-10-CM | POA: Diagnosis not present

## 2012-10-01 DIAGNOSIS — R252 Cramp and spasm: Secondary | ICD-10-CM

## 2012-10-01 MED ORDER — PREDNISONE 20 MG PO TABS: ORAL_TABLET | ORAL | Status: AC
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
## 2012-10-01 NOTE — Telephone Encounter (Signed)
Sue Lush,  Can you please see if the referral for "pain medicine" 08/23/12 has been mishandled somewhere, it looks like jennifer had sent it through [facility], if possible I'd like to request that she see Dr. Jess Barters.  Keahi tells me he has not received any info about this referral yet.

## 2012-10-04 ENCOUNTER — Telehealth: Payer: Self-pay | Admitting: Physical Medicine & Rehabilitation

## 2012-10-04 NOTE — Telephone Encounter (Signed)
Sue Lush, from [facility], needs the status of the referral of patient... On the excel sheet it's in white, so she was inform that it is was rec'd and review process.. If she can get a call back at [phone] that will be great.[name]

## 2012-10-04 NOTE — Telephone Encounter (Signed)
Called and spoke with someone at Dr. Jess Barters office and she states his records are still in review and the nurse that does this(Sybil) is out today. The rep I spoke with sent a phone note to Sybil to inquire about a status update and they will let me know tomorrow

## 2012-10-13 ENCOUNTER — Telehealth: Payer: Self-pay | Admitting: *Deleted

## 2012-10-13 NOTE — Telephone Encounter (Signed)
John Scott called to check on referral they made for John Scott. A call came in on 10/04/12 which I was not here that day, so I am not sure what happened to it. April do you have record of this referral. It was originally placed on 6/914

## 2012-10-13 NOTE — Telephone Encounter (Signed)
Pt called wanting to know the status of his referral . I called to get an update( see phone note from 7/21/) but I havent received a return call. Called today and left a message on the nurse's vm who handles the referral.

## 2012-10-18 NOTE — Telephone Encounter (Signed)
Sue Lush, Can you please check with Sybil to see the status of this request.

## 2012-10-18 NOTE — Telephone Encounter (Signed)
Ok. Called and Dr. Hilliard Clark will be back in the office Friday so they will get try and get him scheduled and let me know

## 2012-10-19 ENCOUNTER — Telehealth: Payer: Self-pay | Admitting: *Deleted

## 2012-10-19 NOTE — Telephone Encounter (Signed)
Contacted John Scott to let him know that he needs to get previous pain clinic notes to Dr. Hilliard Clark office for review in order to get him scheduled. John Scott was upset that it has taken 2 months to get a response from [facility] he did agree to obtain records from previous physician and he was given the number to their office

## 2012-10-19 NOTE — Telephone Encounter (Signed)
I received a message from April Marshall for the pain clinic stating they need records from previous pain clinic. I left a message on pt's vm stating this and that he prob will have to sign a release at previous pain clinic to have them send records to them

## 2012-10-20 DIAGNOSIS — M545 Low back pain: Secondary | ICD-10-CM | POA: Diagnosis not present

## 2012-10-25 ENCOUNTER — Telehealth: Payer: Self-pay | Admitting: *Deleted

## 2012-10-25 NOTE — Telephone Encounter (Signed)
Pt was just seen at [facility] and didn't get a release form signed in order for Dr. Trecia Rogers office to review. Understandably the pt is upset because it has taken so long to get in with another pain clinic. At this point I advised him that we are at a stand still because they will not see him without reviewing previous records. So I advised he is going to have get a records released signed. I advised him that I will let Dr. Ivan Anchors know pt's "dilemma" and if chooses to give him pain medication until he gets in then I would let him know....(but pt was given a new medication at the pain clinic where he was just seen and basically so legally  cannot receive anything from Dr. Ivan Anchors and continue going to the pain clinci that he is currently getting tx for  which he is aware of.)

## 2012-10-26 ENCOUNTER — Telehealth: Payer: Self-pay | Admitting: *Deleted

## 2012-10-26 NOTE — Telephone Encounter (Signed)
Pt requests a rx lubricated catheters. He states the last medication rx'ed at the pain clinic made him unable to void. He did mention this yesterday when I talked with him and I advised him that he needs to make the doctor at the pain clinic to make them aware of SE he may be having due to medication they rx'ed

## 2012-10-26 NOTE — Telephone Encounter (Signed)
I've never managed the strength of medications that John Scott is on, I would recommend he stay established with his current pain management clinic until he estabishes with a new clinic to avoid lapses in his pain medication.

## 2012-10-26 NOTE — Telephone Encounter (Signed)
Pt is aware of this.  °

## 2012-10-27 NOTE — Telephone Encounter (Signed)
Left message on vm

## 2012-10-27 NOTE — Telephone Encounter (Signed)
I'd be happy to help with this, so that I don't bring on unnecessary discomfort can he let me know the diameter of catheters he's used in the past.  This size is usually designated by a number followed by the letter F or the word [place].

## 2012-10-28 ENCOUNTER — Telehealth: Payer: Self-pay | Admitting: Family Medicine

## 2012-10-28 DIAGNOSIS — Z23 Encounter for immunization: Secondary | ICD-10-CM | POA: Diagnosis not present

## 2012-10-28 MED ORDER — DIPHENOXYLATE-ATROPINE 2.5-0.025 MG PO TABS: 1.0000 | ORAL_TABLET | Freq: Four times a day (QID) | ORAL | Status: DC | PRN

## 2012-10-28 MED ORDER — DIAZEPAM 10 MG PO TABS: ORAL_TABLET | ORAL | Status: DC

## 2012-10-28 NOTE — Telephone Encounter (Signed)
[facility] rq [facility]

## 2012-11-17 DIAGNOSIS — M961 Postlaminectomy syndrome, not elsewhere classified: Secondary | ICD-10-CM | POA: Diagnosis not present

## 2012-11-24 ENCOUNTER — Ambulatory Visit (INDEPENDENT_AMBULATORY_CARE_PROVIDER_SITE_OTHER): Payer: Medicare Other | Admitting: Family Medicine

## 2012-11-24 ENCOUNTER — Encounter: Payer: Self-pay | Admitting: Family Medicine

## 2012-11-24 VITALS — BP 119/79 | HR 67 | Wt 259.0 lb

## 2012-11-24 DIAGNOSIS — IMO0002 Reserved for concepts with insufficient information to code with codable children: Secondary | ICD-10-CM | POA: Diagnosis not present

## 2012-11-24 DIAGNOSIS — M4800 Spinal stenosis, site unspecified: Secondary | ICD-10-CM
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
## 2012-11-25 ENCOUNTER — Telehealth: Payer: Self-pay | Admitting: Family Medicine

## 2012-11-25 DIAGNOSIS — IMO0002 Reserved for concepts with insufficient information to code with codable children: Secondary | ICD-10-CM

## 2012-11-25 DIAGNOSIS — M4800 Spinal stenosis, site unspecified: Secondary | ICD-10-CM

## 2012-11-25 DIAGNOSIS — R29898 Other symptoms and signs involving the musculoskeletal system: Secondary | ICD-10-CM

## 2012-11-25 NOTE — Telephone Encounter (Signed)
Conscious sedation

## 2012-12-13 ENCOUNTER — Ambulatory Visit (HOSPITAL_COMMUNITY)
Admission: RE | Admit: 2012-12-13 | Discharge: 2012-12-13 | Disposition: A | Discharge status: Home / Self Care | Payer: Medicare Other | Source: Ambulatory Visit | Attending: Family Medicine | Admitting: Family Medicine

## 2012-12-13 ENCOUNTER — Encounter (HOSPITAL_COMMUNITY): Payer: Self-pay

## 2012-12-13 DIAGNOSIS — IMO0002 Reserved for concepts with insufficient information to code with codable children: Secondary | ICD-10-CM

## 2012-12-13 DIAGNOSIS — M5126 Other intervertebral disc displacement, lumbar region: Secondary | ICD-10-CM | POA: Diagnosis not present

## 2012-12-13 DIAGNOSIS — R29898 Other symptoms and signs involving the musculoskeletal system: Secondary | ICD-10-CM | POA: Diagnosis not present

## 2012-12-13 DIAGNOSIS — M5146 Schmorl's nodes, lumbar region: Secondary | ICD-10-CM | POA: Diagnosis not present

## 2012-12-13 DIAGNOSIS — M4800 Spinal stenosis, site unspecified: Secondary | ICD-10-CM

## 2012-12-13 DIAGNOSIS — M48061 Spinal stenosis, lumbar region without neurogenic claudication: Secondary | ICD-10-CM | POA: Diagnosis not present

## 2012-12-13 MED ORDER — MIDAZOLAM HCL 2 MG/2ML IJ SOLN: 1.0000 mg | INTRAMUSCULAR | Status: DC | PRN

## 2012-12-13 MED ORDER — MIDAZOLAM HCL 2 MG/2ML IJ SOLN
INTRAMUSCULAR | Status: AC | PRN
  Administered 2012-12-13: 1 mg via INTRAVENOUS
  Administered 2012-12-13 (×3): 2 mg via INTRAVENOUS
  Administered 2012-12-13: 1 mg via INTRAVENOUS
  Administered 2012-12-13: 2 mg via INTRAVENOUS

## 2012-12-13 MED ORDER — MIDAZOLAM HCL 2 MG/2ML IJ SOLN
INTRAMUSCULAR | Status: AC
  Filled 2012-12-13: qty 10

## 2012-12-13 NOTE — ED Notes (Signed)
C/o back spasms

## 2012-12-13 NOTE — Progress Notes (Signed)
Escorted to front entrance via pts own wheel chair with friend. Review d/c instructions with pt denies questions
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
## 2012-12-16 DIAGNOSIS — IMO0002 Reserved for concepts with insufficient information to code with codable children: Secondary | ICD-10-CM | POA: Diagnosis not present

## 2012-12-17 ENCOUNTER — Telehealth: Payer: Self-pay | Admitting: Family Medicine

## 2012-12-17 NOTE — Telephone Encounter (Signed)
Called and left a message on referral coordinators vm

## 2012-12-17 NOTE — Telephone Encounter (Signed)
Sue Lush, Since Adrian has not heard about an appt yet, can you please see if Dr. Maeola Harman at [facility] in [place] [phone] has received all necessary info to determine whether or not they will accept the referral placed on 9/10.  The notes under the referral does not confirm that the referral was completed.

## 2012-12-20 DIAGNOSIS — M5137 Other intervertebral disc degeneration, lumbosacral region: Secondary | ICD-10-CM | POA: Diagnosis not present

## 2012-12-20 DIAGNOSIS — M5126 Other intervertebral disc displacement, lumbar region: Secondary | ICD-10-CM | POA: Diagnosis not present

## 2012-12-20 DIAGNOSIS — M4120 Other idiopathic scoliosis, site unspecified: Secondary | ICD-10-CM | POA: Diagnosis not present

## 2012-12-20 DIAGNOSIS — M47817 Spondylosis without myelopathy or radiculopathy, lumbosacral region: Secondary | ICD-10-CM | POA: Diagnosis not present

## 2012-12-21 NOTE — Telephone Encounter (Signed)
Called and was put on hold and had to hang up

## 2012-12-22 NOTE — Telephone Encounter (Signed)
Haley from Dr. Rush Farmer office called and states they have not received any paper work on this pt or referral.; faxed over paper work to their office wity ok confirmation. Demo,ins card office and progress notes faxed and MRI report

## 2012-12-27 ENCOUNTER — Other Ambulatory Visit: Payer: Self-pay | Admitting: *Deleted

## 2012-12-27 DIAGNOSIS — J302 Other seasonal allergic rhinitis: Secondary | ICD-10-CM

## 2012-12-27 DIAGNOSIS — M62838 Other muscle spasm: Secondary | ICD-10-CM

## 2012-12-27 MED ORDER — FLUTICASONE PROPIONATE 50 MCG/ACT NA SUSP: NASAL | Status: DC

## 2013-01-12 DIAGNOSIS — M961 Postlaminectomy syndrome, not elsewhere classified: Secondary | ICD-10-CM | POA: Diagnosis not present

## 2013-01-12 DIAGNOSIS — Z79899 Other long term (current) drug therapy: Secondary | ICD-10-CM | POA: Diagnosis not present

## 2013-01-12 DIAGNOSIS — Z5181 Encounter for therapeutic drug level monitoring: Secondary | ICD-10-CM | POA: Diagnosis not present

## 2013-01-12 DIAGNOSIS — M545 Low back pain: Secondary | ICD-10-CM | POA: Diagnosis not present

## 2013-01-12 DIAGNOSIS — G894 Chronic pain syndrome: Secondary | ICD-10-CM | POA: Diagnosis not present

## 2013-03-14 DIAGNOSIS — G894 Chronic pain syndrome: Secondary | ICD-10-CM | POA: Diagnosis not present

## 2013-03-14 DIAGNOSIS — M961 Postlaminectomy syndrome, not elsewhere classified: Secondary | ICD-10-CM | POA: Diagnosis not present

## 2013-03-14 DIAGNOSIS — M545 Low back pain: Secondary | ICD-10-CM | POA: Diagnosis not present

## 2013-03-25 ENCOUNTER — Telehealth: Payer: Self-pay | Admitting: *Deleted

## 2013-03-25 NOTE — Telephone Encounter (Signed)
Received refill request this am for fluoxetine. I [facility]axed [facility] [place] with a note that states pt needs a f/u since our office has not rx'ed this medication since 2013 and it has been even longer since he was actually seen for this. Called pt and let him know to schedule an appt.Pt states he will call back on monday

## 2013-03-28 ENCOUNTER — Other Ambulatory Visit: Payer: Self-pay | Admitting: *Deleted

## 2013-03-28 DIAGNOSIS — J302 Other seasonal allergic rhinitis: Secondary | ICD-10-CM

## 2013-03-28 MED ORDER — FLUTICASONE PROPIONATE 50 MCG/ACT NA SUSP: NASAL | Status: DC

## 2013-04-01 ENCOUNTER — Ambulatory Visit (INDEPENDENT_AMBULATORY_CARE_PROVIDER_SITE_OTHER): Payer: Medicare Other | Admitting: Family Medicine

## 2013-04-01 ENCOUNTER — Encounter: Payer: Self-pay | Admitting: Family Medicine

## 2013-04-01 VITALS — BP 162/88 | HR 72 | Wt 243.0 lb

## 2013-04-01 DIAGNOSIS — F329 Major depressive disorder, single episode, unspecified: Secondary | ICD-10-CM

## 2013-04-01 DIAGNOSIS — J309 Allergic rhinitis, unspecified: Secondary | ICD-10-CM | POA: Diagnosis not present

## 2013-04-01 DIAGNOSIS — F3289 Other specified depressive episodes: Secondary | ICD-10-CM

## 2013-04-01 MED ORDER — MIRTAZAPINE 30 MG PO TABS: 30.0000 mg | ORAL_TABLET | Freq: Every day | ORAL | Status: DC

## 2013-04-01 MED ORDER — TRIPROLIDINE-PSE 2.5-60 MG PO TABS: 1.0000 | ORAL_TABLET | Freq: Four times a day (QID) | ORAL | Status: DC | PRN

## 2013-04-01 MED ORDER — FLUOXETINE HCL 20 MG PO CAPS: 40.0000 mg | ORAL_CAPSULE | Freq: Every day | ORAL | Status: DC
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
## 2013-04-07 ENCOUNTER — Telehealth: Payer: Self-pay | Admitting: *Deleted

## 2013-04-07 NOTE — Telephone Encounter (Signed)
In re the letter that was faxed to [facility] yesterday:Haley from Dr. Melven Sartorius office at [facility] called and states that if patient is wanting pain management only then it probably would be best that he see Dr. Maryjean Ka or Dr. Quillian Quince. They are in the same office as Dr. Vertell Limber. Hildred Alamin states that she spoke with the patient a little while ago and told him that before they proceed with anything they would need records from his previous pain clinic and they still have not received any records.

## 2013-04-08 NOTE — Telephone Encounter (Signed)
I called patient and gave him the contact info to the place below and he will get the records to their office

## 2013-04-08 NOTE — Telephone Encounter (Signed)
I'm not picky on who he sees at their office, will you please let their office know that I'm fine with him seeing whoever they think is most appropriate.  Can you also please let Mr. Kistler know the situation either by letter or phone.

## 2013-04-14 ENCOUNTER — Telehealth: Payer: Self-pay | Admitting: *Deleted

## 2013-04-14 NOTE — Telephone Encounter (Signed)
Pt left a message that I couldn't understand. Asked him to call and leave a brief message as to what he needs

## 2013-04-21 DIAGNOSIS — M961 Postlaminectomy syndrome, not elsewhere classified: Secondary | ICD-10-CM | POA: Diagnosis not present

## 2013-04-21 DIAGNOSIS — M5137 Other intervertebral disc degeneration, lumbosacral region: Secondary | ICD-10-CM | POA: Diagnosis not present

## 2013-04-21 DIAGNOSIS — G894 Chronic pain syndrome: Secondary | ICD-10-CM | POA: Diagnosis not present

## 2013-04-21 DIAGNOSIS — IMO0002 Reserved for concepts with insufficient information to code with codable children: Secondary | ICD-10-CM | POA: Diagnosis not present

## 2013-04-21 DIAGNOSIS — Z79899 Other long term (current) drug therapy: Secondary | ICD-10-CM | POA: Diagnosis not present

## 2013-04-21 DIAGNOSIS — Z87891 Personal history of nicotine dependence: Secondary | ICD-10-CM | POA: Diagnosis not present

## 2013-04-26 ENCOUNTER — Telehealth: Payer: Self-pay | Admitting: *Deleted

## 2013-04-26 NOTE — Telephone Encounter (Signed)
Pt left a message on my vm stating he has been trying to get in to see a pain management dr. For a year and he has not heard back from [facility]. I called the patient and told him with this case as it was with the last pain clinic we referred him to they will need his previous records. I have told him this before. Pt states he is aware of this but when he called their office he states someone named sheila said she has never heard of him and doesn't have him in their system. I did tell the patient that I spoke with some one there at the end of Jan and they had all of his info and they were waiting on him to give them the previous notes from other pain clinic. At this point not really sure what he wants me to do but I told him I would call the office to see if he was still in their system. I told him its still his responsibility to get those records to them and ultimately this is hold up the progress

## 2013-05-09 DIAGNOSIS — M79609 Pain in unspecified limb: Secondary | ICD-10-CM | POA: Diagnosis not present

## 2013-05-09 DIAGNOSIS — M961 Postlaminectomy syndrome, not elsewhere classified: Secondary | ICD-10-CM | POA: Diagnosis not present

## 2013-05-09 DIAGNOSIS — G894 Chronic pain syndrome: Secondary | ICD-10-CM | POA: Diagnosis not present

## 2013-05-11 ENCOUNTER — Telehealth: Payer: Self-pay | Admitting: *Deleted

## 2013-05-11 NOTE — Telephone Encounter (Signed)
Pt called and left a message stating that it has been six months since he had an MRI and he still hasn't been able to get in with a neuro/pain manegment. Pt also states on my vm it says you can only leave refill request for controlled substances then why wont we prescribe him any. Please see previous phone notes and office visits. Pt has been told multiple times that Dr. Ileene Rubens doesn't rx chronic pain meds,and this is why he needs a referral to pain management in the first place and also the hold up in him getting an appt with pain manegment is that he has not gotten his records over to them.it is the policy with most if not all pain clinics that they must review previous records including that of any other pain clinics that pt has been seen at.  That has been explained to him several times. I have given him the contact info of the office he was last referred to multiple times, often in the same day.  I am not returning this call because this pt has been advised as to what the next step is and he has failed to do it.

## 2013-05-23 ENCOUNTER — Telehealth: Payer: Self-pay | Admitting: Family Medicine

## 2013-05-23 NOTE — Telephone Encounter (Signed)
Abigail Butts- Our manager has called ME TODAY to let me know this patient has never been scheduled with Pain Neuro that Tammy Pyrtle documented on back in September 2014,  so I have re-faxed referral, notes, and ins to [facility] at [facility] [employer] [phone] and I also called their office at [phone] and left a voicemail with Freda Munro to let her know what is going on with this patient and I need help ASAP getting this patient taken care of and scheduled for a appt time at their office. I have also called patient back and let him know what I have done and I will touch base with him daily to let him know what is going on with this process. Thanks, [employer]

## 2013-05-24 NOTE — Telephone Encounter (Signed)
[facility] at phone [phone], Spoke with Hildred Alamin and she informed me she still has all the referral info and patient needs to give her his prior pain records BEFORE she can schedule a appt for him at her office. I called patient and informed him that he has to get his records to [facility] at their office in order to get scheduled for a appt time. Thanks, [employer]

## 2013-05-24 NOTE — Telephone Encounter (Signed)
Ok. They never scheduled him because they never received his previous records.(see previous phone notes) I was told by haley at that office that it is their policy that they have records to review before scheduling the pt. I have communicated this to the pt several times and someone from that office has told him the same thing. He is responsible for signing a release to get his records over to that office

## 2013-05-30 ENCOUNTER — Telehealth: Payer: Self-pay | Admitting: Family Medicine

## 2013-05-30 MED ORDER — DIAZEPAM 10 MG PO TABS: ORAL_TABLET | ORAL | Status: DC

## 2013-05-30 NOTE — Telephone Encounter (Signed)
rx faxed

## 2013-05-30 NOTE — Telephone Encounter (Signed)
Andrea, Rx placed in in-box ready for pickup/faxing.  

## 2013-06-07 ENCOUNTER — Telehealth: Payer: Self-pay | Admitting: Family Medicine

## 2013-06-07 NOTE — Telephone Encounter (Signed)
I received a call from Freda Munro at [facility] and she informed me that after Dr.Harkins reviewed his notes for pain management, they have denied accepting him as a patient. I have not informed patient and waiting on Dr.Hommel to inform me of what he would like for me to do next. Thanks, [employer]

## 2013-06-10 NOTE — Telephone Encounter (Signed)
Seth Bake, Can you please send Mr. John Scott a letter reflecting what [name] informed us.  I'd recommend he continue to see his current pain management team.

## 2013-06-13 ENCOUNTER — Telehealth: Payer: Self-pay | Admitting: *Deleted

## 2013-06-13 NOTE — Telephone Encounter (Signed)
Letter mailed

## 2013-06-13 NOTE — Telephone Encounter (Signed)
Letter done and mailed.

## 2013-06-21 ENCOUNTER — Other Ambulatory Visit: Payer: Self-pay | Admitting: *Deleted

## 2013-06-21 DIAGNOSIS — J302 Other seasonal allergic rhinitis: Secondary | ICD-10-CM

## 2013-06-21 MED ORDER — FLUTICASONE PROPIONATE 50 MCG/ACT NA SUSP: NASAL | Status: DC

## 2013-07-04 ENCOUNTER — Telehealth: Payer: Self-pay | Admitting: Family Medicine

## 2013-07-04 DIAGNOSIS — M4800 Spinal stenosis, site unspecified: Secondary | ICD-10-CM

## 2013-07-04 DIAGNOSIS — IMO0002 Reserved for concepts with insufficient information to code with codable children: Secondary | ICD-10-CM

## 2013-07-04 DIAGNOSIS — M961 Postlaminectomy syndrome, not elsewhere classified: Secondary | ICD-10-CM | POA: Diagnosis not present

## 2013-07-04 DIAGNOSIS — R29898 Other symptoms and signs involving the musculoskeletal system: Secondary | ICD-10-CM

## 2013-07-04 DIAGNOSIS — G894 Chronic pain syndrome: Secondary | ICD-10-CM | POA: Diagnosis not present

## 2013-07-04 DIAGNOSIS — M79609 Pain in unspecified limb: Secondary | ICD-10-CM | POA: Diagnosis not present

## 2013-07-04 NOTE — Telephone Encounter (Signed)
Referral placed and placed up front

## 2013-07-04 NOTE — Telephone Encounter (Signed)
Patient called into office about the letter that he received from Dr. Ileene Rubens stating that [facility] would not accept his as a patient.  He would like to referred to [facility], Dr. Cyndia Diver located at [address], [place], [place] , the telephone number is [phone].

## 2013-07-26 DIAGNOSIS — G894 Chronic pain syndrome: Secondary | ICD-10-CM | POA: Diagnosis not present

## 2013-07-26 DIAGNOSIS — Z87891 Personal history of nicotine dependence: Secondary | ICD-10-CM | POA: Diagnosis not present

## 2013-07-26 DIAGNOSIS — IMO0002 Reserved for concepts with insufficient information to code with codable children: Secondary | ICD-10-CM | POA: Diagnosis not present

## 2013-07-26 DIAGNOSIS — Z79899 Other long term (current) drug therapy: Secondary | ICD-10-CM | POA: Diagnosis not present

## 2013-07-26 DIAGNOSIS — M545 Low back pain, unspecified: Secondary | ICD-10-CM | POA: Diagnosis not present

## 2013-07-26 DIAGNOSIS — Z791 Long term (current) use of non-steroidal anti-inflammatories (NSAID): Secondary | ICD-10-CM | POA: Diagnosis not present

## 2013-07-26 DIAGNOSIS — M48061 Spinal stenosis, lumbar region without neurogenic claudication: Secondary | ICD-10-CM | POA: Diagnosis not present

## 2013-07-26 DIAGNOSIS — Z888 Allergy status to other drugs, medicaments and biological substances status: Secondary | ICD-10-CM | POA: Diagnosis not present

## 2013-07-26 DIAGNOSIS — M5137 Other intervertebral disc degeneration, lumbosacral region: Secondary | ICD-10-CM | POA: Diagnosis not present

## 2013-08-09 ENCOUNTER — Telehealth: Payer: Self-pay | Admitting: *Deleted

## 2013-08-09 NOTE — Telephone Encounter (Signed)
Pharmacy called stating that they are currently out of stock on the Aprodine you prescribed for the pt & wanted to know if you wanted to switch it to something different.  Please advise.

## 2013-08-09 NOTE — Telephone Encounter (Signed)
I'd recommend using a formulation of the individual ingredients until Aprodine is available.

## 2013-08-10 NOTE — Telephone Encounter (Signed)
Pharmacist says they cant give triprolidine by itself so they recommend claritin D

## 2013-08-10 NOTE — Telephone Encounter (Signed)
Pt.notified

## 2013-08-29 DIAGNOSIS — M961 Postlaminectomy syndrome, not elsewhere classified: Secondary | ICD-10-CM | POA: Diagnosis not present

## 2013-08-29 DIAGNOSIS — M545 Low back pain, unspecified: Secondary | ICD-10-CM | POA: Diagnosis not present

## 2013-08-29 DIAGNOSIS — Z79899 Other long term (current) drug therapy: Secondary | ICD-10-CM | POA: Diagnosis not present

## 2013-08-29 DIAGNOSIS — IMO0002 Reserved for concepts with insufficient information to code with codable children: Secondary | ICD-10-CM | POA: Diagnosis not present

## 2013-08-29 DIAGNOSIS — Z5181 Encounter for therapeutic drug level monitoring: Secondary | ICD-10-CM | POA: Diagnosis not present

## 2013-08-29 DIAGNOSIS — G894 Chronic pain syndrome: Secondary | ICD-10-CM | POA: Diagnosis not present

## 2013-09-19 ENCOUNTER — Other Ambulatory Visit: Payer: Self-pay

## 2013-09-19 DIAGNOSIS — J302 Other seasonal allergic rhinitis: Secondary | ICD-10-CM

## 2013-09-19 MED ORDER — FLUTICASONE PROPIONATE 50 MCG/ACT NA SUSP: NASAL | Status: DC

## 2013-10-28 DIAGNOSIS — IMO0002 Reserved for concepts with insufficient information to code with codable children: Secondary | ICD-10-CM | POA: Diagnosis not present

## 2013-10-28 DIAGNOSIS — K219 Gastro-esophageal reflux disease without esophagitis: Secondary | ICD-10-CM | POA: Diagnosis not present

## 2013-10-28 DIAGNOSIS — M5137 Other intervertebral disc degeneration, lumbosacral region: Secondary | ICD-10-CM | POA: Diagnosis not present

## 2013-10-28 DIAGNOSIS — Z885 Allergy status to narcotic agent status: Secondary | ICD-10-CM | POA: Diagnosis not present

## 2013-10-28 DIAGNOSIS — M961 Postlaminectomy syndrome, not elsewhere classified: Secondary | ICD-10-CM | POA: Diagnosis not present

## 2013-10-28 DIAGNOSIS — G894 Chronic pain syndrome: Secondary | ICD-10-CM | POA: Diagnosis not present

## 2013-11-01 DIAGNOSIS — M545 Low back pain, unspecified: Secondary | ICD-10-CM | POA: Diagnosis not present

## 2013-11-01 DIAGNOSIS — M961 Postlaminectomy syndrome, not elsewhere classified: Secondary | ICD-10-CM | POA: Diagnosis not present

## 2013-11-01 DIAGNOSIS — G894 Chronic pain syndrome: Secondary | ICD-10-CM | POA: Diagnosis not present

## 2013-11-01 DIAGNOSIS — Z23 Encounter for immunization: Secondary | ICD-10-CM | POA: Diagnosis not present

## 2013-11-01 DIAGNOSIS — IMO0002 Reserved for concepts with insufficient information to code with codable children: Secondary | ICD-10-CM | POA: Diagnosis not present

## 2013-11-29 DIAGNOSIS — M545 Low back pain, unspecified: Secondary | ICD-10-CM | POA: Diagnosis not present

## 2013-11-29 DIAGNOSIS — G894 Chronic pain syndrome: Secondary | ICD-10-CM | POA: Diagnosis not present

## 2013-11-29 DIAGNOSIS — IMO0002 Reserved for concepts with insufficient information to code with codable children: Secondary | ICD-10-CM | POA: Diagnosis not present

## 2013-11-29 DIAGNOSIS — M961 Postlaminectomy syndrome, not elsewhere classified: Secondary | ICD-10-CM | POA: Diagnosis not present

## 2013-12-01 ENCOUNTER — Telehealth: Payer: Self-pay | Admitting: *Deleted

## 2013-12-01 MED ORDER — DIPHENOXYLATE-ATROPINE 2.5-0.025 MG PO TABS: 1.0000 | ORAL_TABLET | Freq: Four times a day (QID) | ORAL | Status: DC | PRN

## 2013-12-01 NOTE — Telephone Encounter (Signed)
Pt left a message on vm that he has diarrhea when he doesn't eat a bannana a day and wants a rx for lomotil.I looked back in pt's chart and he has had a rx for this in the past for the same issues. Will send refill but will advise pt if it persists and/ or if new sxs develop to schedule an appt

## 2013-12-27 DIAGNOSIS — Z791 Long term (current) use of non-steroidal anti-inflammatories (NSAID): Secondary | ICD-10-CM | POA: Diagnosis not present

## 2013-12-27 DIAGNOSIS — M961 Postlaminectomy syndrome, not elsewhere classified: Secondary | ICD-10-CM | POA: Diagnosis not present

## 2013-12-27 DIAGNOSIS — Z7951 Long term (current) use of inhaled steroids: Secondary | ICD-10-CM | POA: Diagnosis not present

## 2013-12-27 DIAGNOSIS — M545 Low back pain: Secondary | ICD-10-CM | POA: Diagnosis not present

## 2013-12-27 DIAGNOSIS — Z79891 Long term (current) use of opiate analgesic: Secondary | ICD-10-CM | POA: Diagnosis not present

## 2013-12-27 DIAGNOSIS — G8929 Other chronic pain: Secondary | ICD-10-CM | POA: Diagnosis not present

## 2013-12-27 DIAGNOSIS — Z886 Allergy status to analgesic agent status: Secondary | ICD-10-CM | POA: Diagnosis not present

## 2013-12-27 DIAGNOSIS — M5116 Intervertebral disc disorders with radiculopathy, lumbar region: Secondary | ICD-10-CM | POA: Diagnosis not present

## 2013-12-27 DIAGNOSIS — M5416 Radiculopathy, lumbar region: Secondary | ICD-10-CM | POA: Diagnosis not present

## 2013-12-27 DIAGNOSIS — Z79899 Other long term (current) drug therapy: Secondary | ICD-10-CM | POA: Diagnosis not present

## 2013-12-27 DIAGNOSIS — K219 Gastro-esophageal reflux disease without esophagitis: Secondary | ICD-10-CM | POA: Diagnosis not present

## 2013-12-27 DIAGNOSIS — M5417 Radiculopathy, lumbosacral region: Secondary | ICD-10-CM | POA: Diagnosis not present

## 2014-01-24 DIAGNOSIS — G894 Chronic pain syndrome: Secondary | ICD-10-CM | POA: Diagnosis not present

## 2014-01-24 DIAGNOSIS — M539 Dorsopathy, unspecified: Secondary | ICD-10-CM | POA: Diagnosis not present

## 2014-01-24 DIAGNOSIS — M5416 Radiculopathy, lumbar region: Secondary | ICD-10-CM | POA: Diagnosis not present

## 2014-01-24 DIAGNOSIS — M5417 Radiculopathy, lumbosacral region: Secondary | ICD-10-CM | POA: Diagnosis not present

## 2014-02-22 DIAGNOSIS — M5416 Radiculopathy, lumbar region: Secondary | ICD-10-CM | POA: Diagnosis not present

## 2014-03-21 DIAGNOSIS — M5416 Radiculopathy, lumbar region: Secondary | ICD-10-CM | POA: Diagnosis not present

## 2014-03-21 DIAGNOSIS — G894 Chronic pain syndrome: Secondary | ICD-10-CM | POA: Diagnosis not present

## 2014-03-21 DIAGNOSIS — M961 Postlaminectomy syndrome, not elsewhere classified: Secondary | ICD-10-CM | POA: Diagnosis not present

## 2014-03-21 DIAGNOSIS — M5417 Radiculopathy, lumbosacral region: Secondary | ICD-10-CM | POA: Diagnosis not present

## 2014-03-21 DIAGNOSIS — Z5181 Encounter for therapeutic drug level monitoring: Secondary | ICD-10-CM | POA: Diagnosis not present

## 2014-03-21 DIAGNOSIS — Z79899 Other long term (current) drug therapy: Secondary | ICD-10-CM | POA: Diagnosis not present

## 2014-05-03 DIAGNOSIS — Z79899 Other long term (current) drug therapy: Secondary | ICD-10-CM | POA: Diagnosis not present

## 2014-05-03 DIAGNOSIS — G8929 Other chronic pain: Secondary | ICD-10-CM | POA: Diagnosis not present

## 2014-05-03 DIAGNOSIS — Z9852 Vasectomy status: Secondary | ICD-10-CM | POA: Diagnosis not present

## 2014-05-03 DIAGNOSIS — R269 Unspecified abnormalities of gait and mobility: Secondary | ICD-10-CM | POA: Diagnosis not present

## 2014-05-03 DIAGNOSIS — M961 Postlaminectomy syndrome, not elsewhere classified: Secondary | ICD-10-CM | POA: Diagnosis not present

## 2014-05-03 DIAGNOSIS — M5187 Other intervertebral disc disorders, lumbosacral region: Secondary | ICD-10-CM | POA: Diagnosis not present

## 2014-05-03 DIAGNOSIS — G92 Toxic encephalopathy: Secondary | ICD-10-CM | POA: Diagnosis not present

## 2014-05-03 DIAGNOSIS — Z9109 Other allergy status, other than to drugs and biological substances: Secondary | ICD-10-CM | POA: Diagnosis not present

## 2014-05-03 DIAGNOSIS — T50995A Adverse effect of other drugs, medicaments and biological substances, initial encounter: Secondary | ICD-10-CM | POA: Diagnosis not present

## 2014-05-03 DIAGNOSIS — I4891 Unspecified atrial fibrillation: Secondary | ICD-10-CM | POA: Diagnosis not present

## 2014-05-03 DIAGNOSIS — Z888 Allergy status to other drugs, medicaments and biological substances status: Secondary | ICD-10-CM | POA: Diagnosis not present

## 2014-05-03 DIAGNOSIS — I10 Essential (primary) hypertension: Secondary | ICD-10-CM | POA: Diagnosis not present

## 2014-05-03 DIAGNOSIS — M545 Low back pain: Secondary | ICD-10-CM | POA: Diagnosis not present

## 2014-05-03 DIAGNOSIS — T50904A Poisoning by unspecified drugs, medicaments and biological substances, undetermined, initial encounter: Secondary | ICD-10-CM | POA: Diagnosis not present

## 2014-05-03 DIAGNOSIS — T402X1A Poisoning by other opioids, accidental (unintentional), initial encounter: Secondary | ICD-10-CM | POA: Diagnosis not present

## 2014-05-03 DIAGNOSIS — Z87891 Personal history of nicotine dependence: Secondary | ICD-10-CM | POA: Diagnosis not present

## 2014-05-03 DIAGNOSIS — T50901A Poisoning by unspecified drugs, medicaments and biological substances, accidental (unintentional), initial encounter: Secondary | ICD-10-CM | POA: Diagnosis not present

## 2014-05-03 DIAGNOSIS — R4182 Altered mental status, unspecified: Secondary | ICD-10-CM | POA: Diagnosis not present

## 2014-05-04 DIAGNOSIS — M549 Dorsalgia, unspecified: Secondary | ICD-10-CM | POA: Diagnosis not present

## 2014-05-04 DIAGNOSIS — T50904A Poisoning by unspecified drugs, medicaments and biological substances, undetermined, initial encounter: Secondary | ICD-10-CM | POA: Diagnosis not present

## 2014-05-04 DIAGNOSIS — G8929 Other chronic pain: Secondary | ICD-10-CM | POA: Diagnosis not present

## 2014-05-04 DIAGNOSIS — R2681 Unsteadiness on feet: Secondary | ICD-10-CM | POA: Diagnosis not present

## 2014-05-04 DIAGNOSIS — Z79899 Other long term (current) drug therapy: Secondary | ICD-10-CM | POA: Diagnosis not present

## 2014-05-12 DIAGNOSIS — K219 Gastro-esophageal reflux disease without esophagitis: Secondary | ICD-10-CM | POA: Diagnosis not present

## 2014-05-12 DIAGNOSIS — G9340 Encephalopathy, unspecified: Secondary | ICD-10-CM | POA: Diagnosis not present

## 2014-05-12 DIAGNOSIS — M5417 Radiculopathy, lumbosacral region: Secondary | ICD-10-CM | POA: Diagnosis not present

## 2014-05-12 DIAGNOSIS — Z885 Allergy status to narcotic agent status: Secondary | ICD-10-CM | POA: Diagnosis not present

## 2014-05-12 DIAGNOSIS — M961 Postlaminectomy syndrome, not elsewhere classified: Secondary | ICD-10-CM | POA: Diagnosis not present

## 2014-05-12 DIAGNOSIS — G894 Chronic pain syndrome: Secondary | ICD-10-CM | POA: Diagnosis not present

## 2014-05-12 DIAGNOSIS — M5136 Other intervertebral disc degeneration, lumbar region: Secondary | ICD-10-CM | POA: Diagnosis not present

## 2014-05-12 DIAGNOSIS — I158 Other secondary hypertension: Secondary | ICD-10-CM | POA: Diagnosis not present

## 2014-05-15 ENCOUNTER — Other Ambulatory Visit: Payer: Self-pay

## 2014-05-15 DIAGNOSIS — J302 Other seasonal allergic rhinitis: Secondary | ICD-10-CM

## 2014-05-17 DIAGNOSIS — G894 Chronic pain syndrome: Secondary | ICD-10-CM | POA: Diagnosis not present

## 2014-05-17 DIAGNOSIS — M5417 Radiculopathy, lumbosacral region: Secondary | ICD-10-CM | POA: Diagnosis not present

## 2014-05-17 DIAGNOSIS — M5416 Radiculopathy, lumbar region: Secondary | ICD-10-CM | POA: Diagnosis not present

## 2014-05-17 DIAGNOSIS — M961 Postlaminectomy syndrome, not elsewhere classified: Secondary | ICD-10-CM | POA: Diagnosis not present

## 2014-05-19 ENCOUNTER — Ambulatory Visit: Payer: Medicare Other | Admitting: Family Medicine

## 2014-05-22 ENCOUNTER — Ambulatory Visit (INDEPENDENT_AMBULATORY_CARE_PROVIDER_SITE_OTHER): Payer: Medicare Other | Admitting: Family Medicine

## 2014-05-22 ENCOUNTER — Encounter: Payer: Self-pay | Admitting: Family Medicine

## 2014-05-22 VITALS — BP 156/105 | HR 89 | Wt 256.0 lb

## 2014-05-22 DIAGNOSIS — M62838 Other muscle spasm: Secondary | ICD-10-CM | POA: Diagnosis not present

## 2014-05-22 DIAGNOSIS — M5416 Radiculopathy, lumbar region: Secondary | ICD-10-CM | POA: Diagnosis not present

## 2014-05-22 DIAGNOSIS — I10 Essential (primary) hypertension: Secondary | ICD-10-CM | POA: Diagnosis not present

## 2014-05-22 MED ORDER — LISINOPRIL 20 MG PO TABS: ORAL_TABLET | ORAL | Status: DC

## 2014-05-22 MED ORDER — TRIPROLIDINE-PSE 2.5-60 MG PO TABS: 1.0000 | ORAL_TABLET | Freq: Two times a day (BID) | ORAL | Status: DC

## 2014-05-22 MED ORDER — BACLOFEN 10 MG PO TABS: ORAL_TABLET | ORAL | Status: DC

## 2014-05-22 MED ORDER — PREGABALIN 75 MG PO CAPS
75.0000 mg | ORAL_CAPSULE | Freq: Every day | ORAL | Status: DC
Start: 2014-05-22 — End: 2015-04-03
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
## 2014-05-30 ENCOUNTER — Other Ambulatory Visit: Payer: Self-pay

## 2014-05-30 DIAGNOSIS — J302 Other seasonal allergic rhinitis: Secondary | ICD-10-CM

## 2014-05-30 MED ORDER — FLUTICASONE PROPIONATE 50 MCG/ACT NA SUSP: NASAL | Status: DC

## 2014-06-14 DIAGNOSIS — G894 Chronic pain syndrome: Secondary | ICD-10-CM | POA: Diagnosis not present

## 2014-06-14 DIAGNOSIS — M5416 Radiculopathy, lumbar region: Secondary | ICD-10-CM | POA: Diagnosis not present

## 2014-06-14 DIAGNOSIS — M5417 Radiculopathy, lumbosacral region: Secondary | ICD-10-CM | POA: Diagnosis not present

## 2014-06-14 DIAGNOSIS — M961 Postlaminectomy syndrome, not elsewhere classified: Secondary | ICD-10-CM | POA: Diagnosis not present

## 2014-06-19 ENCOUNTER — Ambulatory Visit (INDEPENDENT_AMBULATORY_CARE_PROVIDER_SITE_OTHER): Payer: Medicare Other | Admitting: Family Medicine

## 2014-06-19 ENCOUNTER — Encounter: Payer: Self-pay | Admitting: Family Medicine

## 2014-06-19 VITALS — BP 122/78 | HR 70 | Wt 250.0 lb

## 2014-06-19 DIAGNOSIS — R197 Diarrhea, unspecified: Secondary | ICD-10-CM

## 2014-06-19 DIAGNOSIS — I10 Essential (primary) hypertension: Secondary | ICD-10-CM

## 2014-06-19 MED ORDER — LISINOPRIL 20 MG PO TABS: 10.0000 mg | ORAL_TABLET | Freq: Every day | ORAL | Status: DC
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
## 2014-07-12 DIAGNOSIS — G894 Chronic pain syndrome: Secondary | ICD-10-CM | POA: Diagnosis not present

## 2014-07-12 DIAGNOSIS — M5417 Radiculopathy, lumbosacral region: Secondary | ICD-10-CM | POA: Diagnosis not present

## 2014-07-12 DIAGNOSIS — M961 Postlaminectomy syndrome, not elsewhere classified: Secondary | ICD-10-CM | POA: Diagnosis not present

## 2014-07-12 DIAGNOSIS — M5416 Radiculopathy, lumbar region: Secondary | ICD-10-CM | POA: Diagnosis not present

## 2014-07-14 ENCOUNTER — Other Ambulatory Visit: Payer: Self-pay | Admitting: Family Medicine

## 2014-07-14 MED ORDER — DIAZEPAM 10 MG PO TABS: ORAL_TABLET | ORAL | Status: DC

## 2014-07-14 MED ORDER — DIPHENOXYLATE-ATROPINE 2.5-0.025 MG PO TABS: 1.0000 | ORAL_TABLET | Freq: Four times a day (QID) | ORAL | Status: AC | PRN

## 2014-07-21 DIAGNOSIS — M5417 Radiculopathy, lumbosacral region: Secondary | ICD-10-CM | POA: Diagnosis not present

## 2014-07-21 DIAGNOSIS — M961 Postlaminectomy syndrome, not elsewhere classified: Secondary | ICD-10-CM | POA: Diagnosis not present

## 2014-07-21 DIAGNOSIS — M545 Low back pain: Secondary | ICD-10-CM | POA: Diagnosis not present

## 2014-07-21 DIAGNOSIS — M5136 Other intervertebral disc degeneration, lumbar region: Secondary | ICD-10-CM | POA: Diagnosis not present

## 2014-07-21 DIAGNOSIS — G9340 Encephalopathy, unspecified: Secondary | ICD-10-CM | POA: Diagnosis not present

## 2014-07-21 DIAGNOSIS — G894 Chronic pain syndrome: Secondary | ICD-10-CM | POA: Diagnosis not present

## 2014-07-21 DIAGNOSIS — Z87891 Personal history of nicotine dependence: Secondary | ICD-10-CM | POA: Diagnosis not present

## 2014-07-21 DIAGNOSIS — Z888 Allergy status to other drugs, medicaments and biological substances status: Secondary | ICD-10-CM | POA: Diagnosis not present

## 2014-07-21 DIAGNOSIS — I158 Other secondary hypertension: Secondary | ICD-10-CM | POA: Diagnosis not present

## 2014-08-09 DIAGNOSIS — M5136 Other intervertebral disc degeneration, lumbar region: Secondary | ICD-10-CM | POA: Diagnosis not present

## 2014-08-09 DIAGNOSIS — M961 Postlaminectomy syndrome, not elsewhere classified: Secondary | ICD-10-CM | POA: Diagnosis not present

## 2014-08-09 DIAGNOSIS — G894 Chronic pain syndrome: Secondary | ICD-10-CM | POA: Diagnosis not present

## 2014-08-09 DIAGNOSIS — M5417 Radiculopathy, lumbosacral region: Secondary | ICD-10-CM | POA: Diagnosis not present

## 2014-08-29 DIAGNOSIS — M5417 Radiculopathy, lumbosacral region: Secondary | ICD-10-CM | POA: Diagnosis not present

## 2014-08-29 DIAGNOSIS — M4806 Spinal stenosis, lumbar region: Secondary | ICD-10-CM | POA: Diagnosis not present

## 2014-08-29 DIAGNOSIS — M47816 Spondylosis without myelopathy or radiculopathy, lumbar region: Secondary | ICD-10-CM | POA: Diagnosis not present

## 2014-09-28 DIAGNOSIS — M5416 Radiculopathy, lumbar region: Secondary | ICD-10-CM | POA: Diagnosis not present

## 2014-09-28 DIAGNOSIS — R200 Anesthesia of skin: Secondary | ICD-10-CM | POA: Diagnosis not present

## 2014-09-29 ENCOUNTER — Other Ambulatory Visit: Payer: Self-pay | Admitting: *Deleted

## 2014-09-29 DIAGNOSIS — M62838 Other muscle spasm: Secondary | ICD-10-CM

## 2014-09-29 MED ORDER — BACLOFEN 10 MG PO TABS: ORAL_TABLET | ORAL | Status: DC

## 2014-10-03 DIAGNOSIS — M79604 Pain in right leg: Secondary | ICD-10-CM | POA: Diagnosis not present

## 2014-10-03 DIAGNOSIS — Z79899 Other long term (current) drug therapy: Secondary | ICD-10-CM | POA: Diagnosis not present

## 2014-10-03 DIAGNOSIS — Z5181 Encounter for therapeutic drug level monitoring: Secondary | ICD-10-CM | POA: Diagnosis not present

## 2014-10-03 DIAGNOSIS — M961 Postlaminectomy syndrome, not elsewhere classified: Secondary | ICD-10-CM | POA: Diagnosis not present

## 2014-10-03 DIAGNOSIS — M5417 Radiculopathy, lumbosacral region: Secondary | ICD-10-CM | POA: Diagnosis not present

## 2014-10-03 DIAGNOSIS — G894 Chronic pain syndrome: Secondary | ICD-10-CM | POA: Diagnosis not present

## 2014-10-11 ENCOUNTER — Encounter: Payer: Self-pay | Admitting: Family Medicine

## 2014-10-11 ENCOUNTER — Ambulatory Visit (INDEPENDENT_AMBULATORY_CARE_PROVIDER_SITE_OTHER): Payer: Medicare Other | Admitting: Family Medicine

## 2014-10-11 VITALS — BP 132/73 | HR 80 | Wt 241.0 lb

## 2014-10-11 DIAGNOSIS — R102 Pelvic and perineal pain: Secondary | ICD-10-CM

## 2014-10-11 DIAGNOSIS — T148 Other injury of unspecified body region: Secondary | ICD-10-CM | POA: Diagnosis not present

## 2014-10-11 DIAGNOSIS — I10 Essential (primary) hypertension: Secondary | ICD-10-CM | POA: Diagnosis not present

## 2014-10-11 DIAGNOSIS — IMO0002 Reserved for concepts with insufficient information to code with codable children: Secondary | ICD-10-CM

## 2014-10-11 MED ORDER — LISINOPRIL 20 MG PO TABS: 20.0000 mg | ORAL_TABLET | Freq: Every day | ORAL | Status: DC

## 2014-10-11 MED ORDER — CARISOPRODOL 250 MG PO TABS: 250.0000 mg | ORAL_TABLET | Freq: Four times a day (QID) | ORAL | Status: DC
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
## 2014-10-31 DIAGNOSIS — M5416 Radiculopathy, lumbar region: Secondary | ICD-10-CM | POA: Diagnosis not present

## 2014-10-31 DIAGNOSIS — M5412 Radiculopathy, cervical region: Secondary | ICD-10-CM | POA: Diagnosis not present

## 2014-10-31 DIAGNOSIS — R200 Anesthesia of skin: Secondary | ICD-10-CM | POA: Diagnosis not present

## 2014-11-24 DIAGNOSIS — M5417 Radiculopathy, lumbosacral region: Secondary | ICD-10-CM | POA: Diagnosis not present

## 2014-11-24 DIAGNOSIS — M961 Postlaminectomy syndrome, not elsewhere classified: Secondary | ICD-10-CM | POA: Diagnosis not present

## 2014-11-24 DIAGNOSIS — M79604 Pain in right leg: Secondary | ICD-10-CM | POA: Diagnosis not present

## 2014-11-24 DIAGNOSIS — G894 Chronic pain syndrome: Secondary | ICD-10-CM | POA: Diagnosis not present

## 2014-12-01 DIAGNOSIS — F17200 Nicotine dependence, unspecified, uncomplicated: Secondary | ICD-10-CM | POA: Diagnosis not present

## 2014-12-01 DIAGNOSIS — I10 Essential (primary) hypertension: Secondary | ICD-10-CM | POA: Diagnosis not present

## 2014-12-01 DIAGNOSIS — G9340 Encephalopathy, unspecified: Secondary | ICD-10-CM | POA: Diagnosis not present

## 2014-12-01 DIAGNOSIS — Z791 Long term (current) use of non-steroidal anti-inflammatories (NSAID): Secondary | ICD-10-CM | POA: Diagnosis not present

## 2014-12-01 DIAGNOSIS — Z79899 Other long term (current) drug therapy: Secondary | ICD-10-CM | POA: Diagnosis not present

## 2014-12-01 DIAGNOSIS — M5116 Intervertebral disc disorders with radiculopathy, lumbar region: Secondary | ICD-10-CM | POA: Diagnosis not present

## 2014-12-01 DIAGNOSIS — M5417 Radiculopathy, lumbosacral region: Secondary | ICD-10-CM | POA: Diagnosis not present

## 2014-12-01 DIAGNOSIS — Z888 Allergy status to other drugs, medicaments and biological substances status: Secondary | ICD-10-CM | POA: Diagnosis not present

## 2014-12-01 DIAGNOSIS — K219 Gastro-esophageal reflux disease without esophagitis: Secondary | ICD-10-CM | POA: Diagnosis not present

## 2014-12-01 DIAGNOSIS — M961 Postlaminectomy syndrome, not elsewhere classified: Secondary | ICD-10-CM | POA: Diagnosis not present

## 2014-12-01 DIAGNOSIS — Z79891 Long term (current) use of opiate analgesic: Secondary | ICD-10-CM | POA: Diagnosis not present

## 2015-01-12 ENCOUNTER — Ambulatory Visit: Payer: Medicare Other | Admitting: Family Medicine

## 2015-01-22 DIAGNOSIS — M5416 Radiculopathy, lumbar region: Secondary | ICD-10-CM | POA: Diagnosis not present

## 2015-01-22 DIAGNOSIS — M79604 Pain in right leg: Secondary | ICD-10-CM | POA: Diagnosis not present

## 2015-01-22 DIAGNOSIS — M5417 Radiculopathy, lumbosacral region: Secondary | ICD-10-CM | POA: Diagnosis not present

## 2015-01-22 DIAGNOSIS — M961 Postlaminectomy syndrome, not elsewhere classified: Secondary | ICD-10-CM | POA: Diagnosis not present

## 2015-01-25 DIAGNOSIS — M47812 Spondylosis without myelopathy or radiculopathy, cervical region: Secondary | ICD-10-CM | POA: Diagnosis not present

## 2015-01-25 DIAGNOSIS — R200 Anesthesia of skin: Secondary | ICD-10-CM | POA: Diagnosis not present

## 2015-01-25 DIAGNOSIS — M4802 Spinal stenosis, cervical region: Secondary | ICD-10-CM | POA: Diagnosis not present

## 2015-01-25 DIAGNOSIS — M9971 Connective tissue and disc stenosis of intervertebral foramina of cervical region: Secondary | ICD-10-CM | POA: Diagnosis not present

## 2015-01-25 DIAGNOSIS — M542 Cervicalgia: Secondary | ICD-10-CM | POA: Diagnosis not present

## 2015-03-08 DIAGNOSIS — Z79899 Other long term (current) drug therapy: Secondary | ICD-10-CM | POA: Diagnosis not present

## 2015-03-08 DIAGNOSIS — M961 Postlaminectomy syndrome, not elsewhere classified: Secondary | ICD-10-CM | POA: Diagnosis not present

## 2015-03-08 DIAGNOSIS — G894 Chronic pain syndrome: Secondary | ICD-10-CM | POA: Diagnosis not present

## 2015-03-08 DIAGNOSIS — F17200 Nicotine dependence, unspecified, uncomplicated: Secondary | ICD-10-CM | POA: Diagnosis not present

## 2015-03-08 DIAGNOSIS — Z885 Allergy status to narcotic agent status: Secondary | ICD-10-CM | POA: Diagnosis not present

## 2015-03-08 DIAGNOSIS — M5136 Other intervertebral disc degeneration, lumbar region: Secondary | ICD-10-CM | POA: Diagnosis not present

## 2015-03-08 DIAGNOSIS — I10 Essential (primary) hypertension: Secondary | ICD-10-CM | POA: Diagnosis not present

## 2015-03-08 DIAGNOSIS — Z7951 Long term (current) use of inhaled steroids: Secondary | ICD-10-CM | POA: Diagnosis not present

## 2015-03-08 DIAGNOSIS — M5417 Radiculopathy, lumbosacral region: Secondary | ICD-10-CM | POA: Diagnosis not present

## 2015-03-08 NOTE — Telephone Encounter (Signed)
error 

## 2015-03-09 ENCOUNTER — Telehealth: Payer: Self-pay | Admitting: Family Medicine

## 2015-03-09 NOTE — Telephone Encounter (Signed)
Gabapentin refill declined, he's already on Lyrica.

## 2015-03-14 DIAGNOSIS — Z23 Encounter for immunization: Secondary | ICD-10-CM | POA: Diagnosis not present

## 2015-03-20 ENCOUNTER — Other Ambulatory Visit: Payer: Self-pay

## 2015-03-20 DIAGNOSIS — M79604 Pain in right leg: Secondary | ICD-10-CM | POA: Diagnosis not present

## 2015-03-20 DIAGNOSIS — M5416 Radiculopathy, lumbar region: Secondary | ICD-10-CM | POA: Diagnosis not present

## 2015-03-20 DIAGNOSIS — M961 Postlaminectomy syndrome, not elsewhere classified: Secondary | ICD-10-CM | POA: Diagnosis not present

## 2015-03-20 DIAGNOSIS — M5417 Radiculopathy, lumbosacral region: Secondary | ICD-10-CM | POA: Diagnosis not present

## 2015-03-23 DIAGNOSIS — G609 Hereditary and idiopathic neuropathy, unspecified: Secondary | ICD-10-CM | POA: Insufficient documentation

## 2015-03-23 DIAGNOSIS — M5416 Radiculopathy, lumbar region: Secondary | ICD-10-CM | POA: Diagnosis not present

## 2015-03-23 DIAGNOSIS — M4806 Spinal stenosis, lumbar region: Secondary | ICD-10-CM | POA: Diagnosis not present

## 2015-03-27 ENCOUNTER — Ambulatory Visit: Payer: Medicare Other | Admitting: Family Medicine

## 2015-04-03 ENCOUNTER — Ambulatory Visit (INDEPENDENT_AMBULATORY_CARE_PROVIDER_SITE_OTHER): Payer: Medicare Other | Admitting: Family Medicine

## 2015-04-03 ENCOUNTER — Encounter: Payer: Self-pay | Admitting: Family Medicine

## 2015-04-03 VITALS — BP 146/85 | HR 79 | Wt 210.0 lb

## 2015-04-03 DIAGNOSIS — B351 Tinea unguium: Secondary | ICD-10-CM

## 2015-04-03 DIAGNOSIS — M62838 Other muscle spasm: Secondary | ICD-10-CM

## 2015-04-03 DIAGNOSIS — I10 Essential (primary) hypertension: Secondary | ICD-10-CM

## 2015-04-03 DIAGNOSIS — M5416 Radiculopathy, lumbar region: Secondary | ICD-10-CM | POA: Diagnosis not present

## 2015-04-03 MED ORDER — LISINOPRIL-HYDROCHLOROTHIAZIDE 20-12.5 MG PO TABS: 1.0000 | ORAL_TABLET | Freq: Every day | ORAL | Status: DC

## 2015-04-03 MED ORDER — TERBINAFINE HCL 250 MG PO TABS: 250.0000 mg | ORAL_TABLET | Freq: Every day | ORAL | Status: DC

## 2015-04-03 MED ORDER — DIAZEPAM 10 MG PO TABS: ORAL_TABLET | ORAL | Status: DC

## 2015-04-03 MED ORDER — BACLOFEN 10 MG PO TABS: ORAL_TABLET | ORAL | Status: DC

## 2015-04-03 MED ORDER — CARISOPRODOL 250 MG PO TABS: 250.0000 mg | ORAL_TABLET | Freq: Four times a day (QID) | ORAL | Status: DC
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
## 2015-04-12 ENCOUNTER — Telehealth: Payer: Self-pay

## 2015-04-12 DIAGNOSIS — Z79899 Other long term (current) drug therapy: Secondary | ICD-10-CM | POA: Diagnosis not present

## 2015-04-12 DIAGNOSIS — M961 Postlaminectomy syndrome, not elsewhere classified: Secondary | ICD-10-CM | POA: Diagnosis not present

## 2015-04-12 DIAGNOSIS — G8929 Other chronic pain: Secondary | ICD-10-CM | POA: Diagnosis not present

## 2015-04-12 NOTE — Telephone Encounter (Signed)
Pt is not happy with the new pain management clinic and would like a new referral.

## 2015-04-13 NOTE — Telephone Encounter (Signed)
Pt would like to be referred to a new pain management clinic

## 2015-04-13 NOTE — Telephone Encounter (Signed)
Ok, please notify Jenny Reichmann about this

## 2015-04-13 NOTE — Telephone Encounter (Signed)
Sent referral to [facility] they will call and schedule with patient for good appointment time. - CF

## 2015-04-19 DIAGNOSIS — M4807 Spinal stenosis, lumbosacral region: Secondary | ICD-10-CM | POA: Diagnosis not present

## 2015-04-19 DIAGNOSIS — M5416 Radiculopathy, lumbar region: Secondary | ICD-10-CM | POA: Diagnosis not present

## 2015-04-19 DIAGNOSIS — M5137 Other intervertebral disc degeneration, lumbosacral region: Secondary | ICD-10-CM | POA: Diagnosis not present

## 2015-04-19 DIAGNOSIS — M4726 Other spondylosis with radiculopathy, lumbar region: Secondary | ICD-10-CM | POA: Diagnosis not present

## 2015-04-19 DIAGNOSIS — M4806 Spinal stenosis, lumbar region: Secondary | ICD-10-CM | POA: Diagnosis not present

## 2015-04-24 DIAGNOSIS — F339 Major depressive disorder, recurrent, unspecified: Secondary | ICD-10-CM | POA: Diagnosis not present

## 2015-05-15 DIAGNOSIS — F339 Major depressive disorder, recurrent, unspecified: Secondary | ICD-10-CM | POA: Diagnosis not present

## 2015-05-16 DIAGNOSIS — M961 Postlaminectomy syndrome, not elsewhere classified: Secondary | ICD-10-CM | POA: Diagnosis not present

## 2015-05-16 DIAGNOSIS — M5416 Radiculopathy, lumbar region: Secondary | ICD-10-CM | POA: Diagnosis not present

## 2015-05-16 DIAGNOSIS — Z5181 Encounter for therapeutic drug level monitoring: Secondary | ICD-10-CM | POA: Diagnosis not present

## 2015-05-16 DIAGNOSIS — M79604 Pain in right leg: Secondary | ICD-10-CM | POA: Diagnosis not present

## 2015-05-16 DIAGNOSIS — G894 Chronic pain syndrome: Secondary | ICD-10-CM | POA: Diagnosis not present

## 2015-05-16 DIAGNOSIS — Z79899 Other long term (current) drug therapy: Secondary | ICD-10-CM | POA: Diagnosis not present

## 2015-05-22 DIAGNOSIS — M9933 Osseous stenosis of neural canal of lumbar region: Secondary | ICD-10-CM | POA: Diagnosis not present

## 2015-05-24 DIAGNOSIS — Z885 Allergy status to narcotic agent status: Secondary | ICD-10-CM | POA: Diagnosis not present

## 2015-05-24 DIAGNOSIS — M5417 Radiculopathy, lumbosacral region: Secondary | ICD-10-CM | POA: Diagnosis not present

## 2015-05-24 DIAGNOSIS — I10 Essential (primary) hypertension: Secondary | ICD-10-CM | POA: Diagnosis not present

## 2015-05-24 DIAGNOSIS — M5136 Other intervertebral disc degeneration, lumbar region: Secondary | ICD-10-CM | POA: Diagnosis not present

## 2015-05-24 DIAGNOSIS — G894 Chronic pain syndrome: Secondary | ICD-10-CM | POA: Diagnosis not present

## 2015-05-24 DIAGNOSIS — F17200 Nicotine dependence, unspecified, uncomplicated: Secondary | ICD-10-CM | POA: Diagnosis not present

## 2015-05-24 DIAGNOSIS — M961 Postlaminectomy syndrome, not elsewhere classified: Secondary | ICD-10-CM | POA: Diagnosis not present

## 2015-06-07 DIAGNOSIS — Z888 Allergy status to other drugs, medicaments and biological substances status: Secondary | ICD-10-CM | POA: Diagnosis not present

## 2015-06-07 DIAGNOSIS — K219 Gastro-esophageal reflux disease without esophagitis: Secondary | ICD-10-CM | POA: Diagnosis not present

## 2015-06-07 DIAGNOSIS — M4697 Unspecified inflammatory spondylopathy, lumbosacral region: Secondary | ICD-10-CM | POA: Diagnosis not present

## 2015-06-07 DIAGNOSIS — M4800 Spinal stenosis, site unspecified: Secondary | ICD-10-CM | POA: Diagnosis not present

## 2015-06-07 DIAGNOSIS — I10 Essential (primary) hypertension: Secondary | ICD-10-CM | POA: Diagnosis not present

## 2015-06-07 DIAGNOSIS — M47817 Spondylosis without myelopathy or radiculopathy, lumbosacral region: Secondary | ICD-10-CM | POA: Diagnosis not present

## 2015-06-07 DIAGNOSIS — F17210 Nicotine dependence, cigarettes, uncomplicated: Secondary | ICD-10-CM | POA: Diagnosis not present

## 2015-06-14 DIAGNOSIS — M79604 Pain in right leg: Secondary | ICD-10-CM | POA: Diagnosis not present

## 2015-06-14 DIAGNOSIS — M961 Postlaminectomy syndrome, not elsewhere classified: Secondary | ICD-10-CM | POA: Diagnosis not present

## 2015-06-14 DIAGNOSIS — M5416 Radiculopathy, lumbar region: Secondary | ICD-10-CM | POA: Diagnosis not present

## 2015-06-14 DIAGNOSIS — M5136 Other intervertebral disc degeneration, lumbar region: Secondary | ICD-10-CM | POA: Diagnosis not present

## 2015-07-19 DIAGNOSIS — M961 Postlaminectomy syndrome, not elsewhere classified: Secondary | ICD-10-CM | POA: Diagnosis not present

## 2015-07-19 DIAGNOSIS — M5136 Other intervertebral disc degeneration, lumbar region: Secondary | ICD-10-CM | POA: Diagnosis not present

## 2015-07-19 DIAGNOSIS — M5416 Radiculopathy, lumbar region: Secondary | ICD-10-CM | POA: Diagnosis not present

## 2015-07-19 DIAGNOSIS — M79604 Pain in right leg: Secondary | ICD-10-CM | POA: Diagnosis not present

## 2015-08-15 ENCOUNTER — Other Ambulatory Visit: Payer: Self-pay | Admitting: Family Medicine

## 2015-08-17 ENCOUNTER — Telehealth: Payer: Self-pay

## 2015-08-17 DIAGNOSIS — M5416 Radiculopathy, lumbar region: Secondary | ICD-10-CM

## 2015-08-17 MED ORDER — CARISOPRODOL 250 MG PO TABS: 250.0000 mg | ORAL_TABLET | Freq: Four times a day (QID) | ORAL | Status: DC

## 2015-08-17 NOTE — Telephone Encounter (Signed)
Pt notified Rx faxed.

## 2015-08-17 NOTE — Telephone Encounter (Signed)
Pt called asking for a refill on soma.  He stated that he is waiting to have neurosurgery. He said that this will be the last time that he ask for it. Please advise.

## 2015-08-17 NOTE — Telephone Encounter (Signed)
[place], Rx placed in in-box ready for pickup/faxing.  

## 2015-08-21 ENCOUNTER — Telehealth: Payer: Self-pay | Admitting: *Deleted

## 2015-08-21 NOTE — Telephone Encounter (Signed)
PA submitted through covermymeds. Spoke with patient to see what he has tried and failed and the patient states he could not recall. The patient states he was not worried about the cost and that he was prepared to pay the $20 out of pocket for the medication. Still submitted the pa through the patients insurance

## 2015-08-22 NOTE — Telephone Encounter (Signed)
PA has been denied. Dr. Ileene Rubens aware

## 2015-09-19 DIAGNOSIS — M5136 Other intervertebral disc degeneration, lumbar region: Secondary | ICD-10-CM | POA: Diagnosis not present

## 2015-09-19 DIAGNOSIS — M961 Postlaminectomy syndrome, not elsewhere classified: Secondary | ICD-10-CM | POA: Diagnosis not present

## 2015-09-19 DIAGNOSIS — M79604 Pain in right leg: Secondary | ICD-10-CM | POA: Diagnosis not present

## 2015-09-19 DIAGNOSIS — M5416 Radiculopathy, lumbar region: Secondary | ICD-10-CM | POA: Diagnosis not present

## 2015-10-02 ENCOUNTER — Telehealth: Payer: Self-pay

## 2015-10-02 ENCOUNTER — Ambulatory Visit: Payer: Medicare Other | Admitting: Family Medicine

## 2015-10-02 ENCOUNTER — Encounter: Payer: Self-pay | Admitting: Family Medicine

## 2015-10-02 ENCOUNTER — Ambulatory Visit (INDEPENDENT_AMBULATORY_CARE_PROVIDER_SITE_OTHER): Payer: Medicare Other | Admitting: Family Medicine

## 2015-10-02 VITALS — BP 105/69 | HR 70 | Wt 229.0 lb

## 2015-10-02 DIAGNOSIS — L8990 Pressure ulcer of unspecified site, unspecified stage: Secondary | ICD-10-CM

## 2015-10-02 DIAGNOSIS — R29898 Other symptoms and signs involving the musculoskeletal system: Secondary | ICD-10-CM | POA: Diagnosis not present

## 2015-10-02 DIAGNOSIS — M5416 Radiculopathy, lumbar region: Secondary | ICD-10-CM

## 2015-10-02 DIAGNOSIS — L8992 Pressure ulcer of unspecified site, stage 2: Secondary | ICD-10-CM

## 2015-10-02 MED ORDER — PREDNISONE 20 MG PO TABS: ORAL_TABLET | ORAL | Status: AC

## 2015-10-02 MED ORDER — SULFAMETHOXAZOLE-TRIMETHOPRIM 800-160 MG PO TABS: 1.0000 | ORAL_TABLET | Freq: Two times a day (BID) | ORAL | Status: DC

## 2015-10-02 MED ORDER — KETOROLAC TROMETHAMINE 60 MG/2ML IM SOLN
60.0000 mg | Freq: Once | INTRAMUSCULAR | Status: AC
  Administered 2015-10-02: 60 mg via INTRAMUSCULAR

## 2015-10-02 MED ORDER — DIPHENOXYLATE-ATROPINE 2.5-0.025 MG PO TABS: 1.0000 | ORAL_TABLET | Freq: Four times a day (QID) | ORAL | Status: DC | PRN

## 2015-10-02 MED ORDER — CLOTRIMAZOLE 1 % EX CREA: TOPICAL_CREAM | CUTANEOUS | Status: AC
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
## 2015-10-02 NOTE — Telephone Encounter (Signed)
Pt just wanted to remind to put in a new referral to a pain clinic. Back in January he was sent to [facility].  New order pending.

## 2015-10-03 NOTE — Telephone Encounter (Signed)
Will you please ask him which pain clinic he wants me to refer him to? I'm asking because this will be the 4th referral for a pain clinic since I met him in 2013.

## 2015-10-03 NOTE — Telephone Encounter (Signed)
He prefer somewhere in [place] but he do not want to go to the last 2 places that we sent him to.

## 2015-10-04 NOTE — Telephone Encounter (Signed)
Will you please ask him which pain clinic he wants me to refer him to? I need a specific clinic.

## 2015-10-04 NOTE — Telephone Encounter (Signed)
Awaiting call back.

## 2015-10-17 ENCOUNTER — Other Ambulatory Visit: Payer: Self-pay | Admitting: Family Medicine

## 2015-10-17 DIAGNOSIS — M5416 Radiculopathy, lumbar region: Secondary | ICD-10-CM

## 2015-10-17 NOTE — Telephone Encounter (Signed)
I have to decline this since the FDA has only approved this for a maximum of 3 weeks and he's already reached this maximum.

## 2015-10-18 ENCOUNTER — Telehealth: Payer: Self-pay

## 2015-10-18 NOTE — Telephone Encounter (Signed)
Pt.notified

## 2015-10-19 MED ORDER — METHOCARBAMOL 750 MG PO TABS: 750.0000 mg | ORAL_TABLET | Freq: Four times a day (QID) | ORAL | 1 refills | Status: DC | PRN

## 2015-10-19 NOTE — Telephone Encounter (Signed)
Robaxin sent to pharmacy

## 2015-10-19 NOTE — Telephone Encounter (Signed)
Pt.notified

## 2015-11-15 DIAGNOSIS — M5136 Other intervertebral disc degeneration, lumbar region: Secondary | ICD-10-CM | POA: Diagnosis not present

## 2015-11-15 DIAGNOSIS — M1288 Other specific arthropathies, not elsewhere classified, other specified site: Secondary | ICD-10-CM | POA: Diagnosis not present

## 2015-11-15 DIAGNOSIS — M5416 Radiculopathy, lumbar region: Secondary | ICD-10-CM | POA: Diagnosis not present

## 2015-11-15 DIAGNOSIS — M5417 Radiculopathy, lumbosacral region: Secondary | ICD-10-CM | POA: Diagnosis not present

## 2016-01-10 DIAGNOSIS — M5136 Other intervertebral disc degeneration, lumbar region: Secondary | ICD-10-CM | POA: Diagnosis not present

## 2016-01-10 DIAGNOSIS — Z5181 Encounter for therapeutic drug level monitoring: Secondary | ICD-10-CM | POA: Diagnosis not present

## 2016-01-10 DIAGNOSIS — M5416 Radiculopathy, lumbar region: Secondary | ICD-10-CM | POA: Diagnosis not present

## 2016-01-10 DIAGNOSIS — M5417 Radiculopathy, lumbosacral region: Secondary | ICD-10-CM | POA: Diagnosis not present

## 2016-01-10 DIAGNOSIS — F3289 Other specified depressive episodes: Secondary | ICD-10-CM | POA: Diagnosis not present

## 2016-01-10 DIAGNOSIS — Z79899 Other long term (current) drug therapy: Secondary | ICD-10-CM | POA: Diagnosis not present

## 2016-01-10 DIAGNOSIS — M1288 Other specific arthropathies, not elsewhere classified, other specified site: Secondary | ICD-10-CM | POA: Diagnosis not present

## 2016-01-31 DIAGNOSIS — M5417 Radiculopathy, lumbosacral region: Secondary | ICD-10-CM | POA: Diagnosis not present

## 2016-01-31 DIAGNOSIS — M5416 Radiculopathy, lumbar region: Secondary | ICD-10-CM | POA: Diagnosis not present

## 2016-01-31 DIAGNOSIS — Z720 Tobacco use: Secondary | ICD-10-CM | POA: Diagnosis not present

## 2016-01-31 DIAGNOSIS — M5442 Lumbago with sciatica, left side: Secondary | ICD-10-CM | POA: Diagnosis not present

## 2016-01-31 DIAGNOSIS — F3289 Other specified depressive episodes: Secondary | ICD-10-CM | POA: Diagnosis not present

## 2016-02-29 ENCOUNTER — Ambulatory Visit (INDEPENDENT_AMBULATORY_CARE_PROVIDER_SITE_OTHER): Payer: Medicare Other | Admitting: Sports Medicine

## 2016-02-29 ENCOUNTER — Other Ambulatory Visit: Payer: Self-pay

## 2016-02-29 ENCOUNTER — Encounter: Payer: Self-pay | Admitting: Sports Medicine

## 2016-02-29 DIAGNOSIS — L603 Nail dystrophy: Secondary | ICD-10-CM | POA: Insufficient documentation

## 2016-02-29 DIAGNOSIS — L89891 Pressure ulcer of other site, stage 1: Secondary | ICD-10-CM

## 2016-02-29 DIAGNOSIS — M62838 Other muscle spasm: Secondary | ICD-10-CM | POA: Diagnosis not present

## 2016-02-29 DIAGNOSIS — M961 Postlaminectomy syndrome, not elsewhere classified: Secondary | ICD-10-CM

## 2016-02-29 DIAGNOSIS — L8990 Pressure ulcer of unspecified site, unspecified stage: Secondary | ICD-10-CM | POA: Insufficient documentation

## 2016-02-29 MED ORDER — METHOCARBAMOL 750 MG PO TABS: 750.0000 mg | ORAL_TABLET | Freq: Four times a day (QID) | ORAL | 0 refills | Status: DC | PRN

## 2016-02-29 MED ORDER — DOXYCYCLINE HYCLATE 100 MG PO TABS: 100.0000 mg | ORAL_TABLET | Freq: Two times a day (BID) | ORAL | 0 refills | Status: AC

## 2016-02-29 MED ORDER — BACLOFEN 10 MG PO TABS: ORAL_TABLET | ORAL | 0 refills | Status: DC

## 2016-02-29 NOTE — Assessment & Plan Note (Signed)
Home health wound care referral for toenail trimming

## 2016-02-29 NOTE — Telephone Encounter (Signed)
John Scott is coming in today to see Dr Dianah Field for an acute visit. He is needing a refill on baclofen and methocarbamol until he can establish care with another provider. Medications pended. Please advise.

## 2016-02-29 NOTE — Telephone Encounter (Signed)
I have never seen this patient, this needs to come from either his neurosurgeon or his pain management doctor, both of which he is established with.

## 2016-02-29 NOTE — Progress Notes (Signed)
   Subjective:    I'm seeing this patient as a consultation for:  Dr. Marcial Pacas  CC: Left foot wound  HPI: This is a 65 year old male disabled individual, he is seen in the chronic pain clinic, it sounds as though he had a sock placed over his foot too tightly, when it was removed he had some skin breakdown over the dorsum of his ankle. He really has minimal pain on the foot, no constitutional symptoms, only mild swelling and redness.  Onychodystrophy: Would like a referral for home health to help with toenail clipping.  Postlaminectomy syndrome: Currently seen with a pain doctor, I did tell I would provide a single refill of his Robaxin and baclofen. He agrees to follow-up with pain management or neurosurgeon for further refills.  Past medical history:  Negative.  See flowsheet/record as well for more information.  Surgical history: Negative.  See flowsheet/record as well for more information.  Family history: Negative.  See flowsheet/record as well for more information.  Social history: Negative.  See flowsheet/record as well for more information.  Allergies, and medications have been entered into the medical record, reviewed, and no changes needed.   Review of Systems: No headache, visual changes, nausea, vomiting, diarrhea, constipation, dizziness, abdominal pain, skin rash, fevers, chills, night sweats, weight loss, swollen lymph nodes, body aches, joint swelling, muscle aches, chest pain, shortness of breath, mood changes, visual or auditory hallucinations.   Objective:   General: Well Developed, well nourished, and in no acute distress.  Neuro/Psych: Alert and oriented x3, extra-ocular muscles intact, able to move all 4 extremities, sensation grossly intact. Skin: Warm and dry, no rashes noted.  Respiratory: Not using accessory muscles, speaking in full sentences, trachea midline.  Cardiovascular: Pulses palpable, no extremity edema. Abdomen: Does not appear distended. Left  Foot: Visible swollen, there is a healing pressure ulcer with minimal surrounding erythema without induration, no drainage over the dorsal ankle Range of motion is full in all directions. Strength is 5/5 in all directions. No hallux valgus. No pes cavus or pes planus. No abnormal callus noted. No pain over the navicular prominence, or base of fifth metatarsal. No tenderness to palpation of the calcaneal insertion of plantar fascia. No pain at the Achilles insertion. No pain over the calcaneal bursa. No pain of the retrocalcaneal bursa. No tenderness to palpation over the tarsals, metatarsals, or phalanges. No hallux rigidus or limitus. No tenderness palpation over interphalangeal joints. No pain with compression of the metatarsal heads. Neurovascularly intact distally. Toenails are in terrible shape with onychodystrophy.  Impression and Recommendations:   This case required medical decision making of moderate complexity.  Pressure sore This is essentially healing. I applied some antibiotic ointment, as well as a Tegaderm. He had a sock that was too tight that created the pressure ulcer. This has been discontinued and it will continue to heal on its own. There was evidence of only mild erythema so we are going to do a short course of doxycycline.   Postlaminectomy syndrome Continue close follow-up with pain management and neurosurgery. I will refill his baclofen and methocarbamol however I asked him to keep further refills with his pain doctor and/or neurosurgeon.  Onychodystrophy Home health wound care referral for toenail trimming

## 2016-02-29 NOTE — Assessment & Plan Note (Signed)
Continue close follow-up with pain management and neurosurgery. I will refill his baclofen and methocarbamol however I asked him to keep further refills with his pain doctor and/or neurosurgeon.

## 2016-02-29 NOTE — Assessment & Plan Note (Signed)
This is essentially healing. I applied some antibiotic ointment, as well as a Tegaderm. He had a sock that was too tight that created the pressure ulcer. This has been discontinued and it will continue to heal on its own. There was evidence of only mild erythema so we are going to do a short course of doxycycline.

## 2016-03-03 DIAGNOSIS — M961 Postlaminectomy syndrome, not elsewhere classified: Secondary | ICD-10-CM | POA: Diagnosis not present

## 2016-03-03 DIAGNOSIS — L89891 Pressure ulcer of other site, stage 1: Secondary | ICD-10-CM | POA: Diagnosis not present

## 2016-03-03 DIAGNOSIS — L603 Nail dystrophy: Secondary | ICD-10-CM | POA: Diagnosis not present

## 2016-03-03 DIAGNOSIS — B9689 Other specified bacterial agents as the cause of diseases classified elsewhere: Secondary | ICD-10-CM | POA: Diagnosis not present

## 2016-03-03 DIAGNOSIS — Z7951 Long term (current) use of inhaled steroids: Secondary | ICD-10-CM | POA: Diagnosis not present

## 2016-03-03 DIAGNOSIS — Z792 Long term (current) use of antibiotics: Secondary | ICD-10-CM | POA: Diagnosis not present

## 2016-03-04 ENCOUNTER — Telehealth: Payer: Self-pay

## 2016-03-04 NOTE — Telephone Encounter (Signed)
Home health nurse called asking for an order to treat pt wound on foot. Stated it was necessary even for the band-aid. Please assist.

## 2016-03-04 NOTE — Telephone Encounter (Signed)
Verbal order given  

## 2016-03-05 DIAGNOSIS — B9689 Other specified bacterial agents as the cause of diseases classified elsewhere: Secondary | ICD-10-CM | POA: Diagnosis not present

## 2016-03-05 DIAGNOSIS — L89891 Pressure ulcer of other site, stage 1: Secondary | ICD-10-CM | POA: Diagnosis not present

## 2016-03-05 DIAGNOSIS — Z7951 Long term (current) use of inhaled steroids: Secondary | ICD-10-CM | POA: Diagnosis not present

## 2016-03-05 DIAGNOSIS — Z792 Long term (current) use of antibiotics: Secondary | ICD-10-CM | POA: Diagnosis not present

## 2016-03-05 DIAGNOSIS — M961 Postlaminectomy syndrome, not elsewhere classified: Secondary | ICD-10-CM | POA: Diagnosis not present

## 2016-03-05 DIAGNOSIS — L603 Nail dystrophy: Secondary | ICD-10-CM | POA: Diagnosis not present

## 2016-03-07 ENCOUNTER — Telehealth: Payer: Self-pay

## 2016-03-07 NOTE — Telephone Encounter (Signed)
Home Health nurse left VM stating pt is unable to tolerate home health care and she will be discontinuing therapy due to his pain levels during assessment.

## 2016-03-13 DIAGNOSIS — B9689 Other specified bacterial agents as the cause of diseases classified elsewhere: Secondary | ICD-10-CM | POA: Diagnosis not present

## 2016-03-13 DIAGNOSIS — M961 Postlaminectomy syndrome, not elsewhere classified: Secondary | ICD-10-CM | POA: Diagnosis not present

## 2016-03-13 DIAGNOSIS — Z7951 Long term (current) use of inhaled steroids: Secondary | ICD-10-CM | POA: Diagnosis not present

## 2016-03-13 DIAGNOSIS — L603 Nail dystrophy: Secondary | ICD-10-CM | POA: Diagnosis not present

## 2016-03-13 DIAGNOSIS — L89891 Pressure ulcer of other site, stage 1: Secondary | ICD-10-CM | POA: Diagnosis not present

## 2016-03-13 DIAGNOSIS — Z792 Long term (current) use of antibiotics: Secondary | ICD-10-CM | POA: Diagnosis not present

## 2016-03-14 ENCOUNTER — Telehealth: Payer: Self-pay

## 2016-03-14 DIAGNOSIS — Z792 Long term (current) use of antibiotics: Secondary | ICD-10-CM | POA: Diagnosis not present

## 2016-03-14 DIAGNOSIS — B9689 Other specified bacterial agents as the cause of diseases classified elsewhere: Secondary | ICD-10-CM | POA: Diagnosis not present

## 2016-03-14 DIAGNOSIS — Z7951 Long term (current) use of inhaled steroids: Secondary | ICD-10-CM | POA: Diagnosis not present

## 2016-03-14 DIAGNOSIS — L89891 Pressure ulcer of other site, stage 1: Secondary | ICD-10-CM | POA: Diagnosis not present

## 2016-03-14 DIAGNOSIS — M961 Postlaminectomy syndrome, not elsewhere classified: Secondary | ICD-10-CM | POA: Diagnosis not present

## 2016-03-14 DIAGNOSIS — L603 Nail dystrophy: Secondary | ICD-10-CM | POA: Diagnosis not present

## 2016-03-14 MED ORDER — DIAZEPAM 10 MG PO TABS: ORAL_TABLET | ORAL | 0 refills | Status: DC

## 2016-03-14 NOTE — Telephone Encounter (Signed)
John Scott is a former John Scott patient and would like a refill on diazepam. He will establish care soon. Please advise.

## 2016-03-14 NOTE — Telephone Encounter (Signed)
Patient advised.

## 2016-03-14 NOTE — Telephone Encounter (Signed)
Short course of Valium prescribed, no more Valium until he establishes care with one of our other providers.

## 2016-03-16 DIAGNOSIS — Z7951 Long term (current) use of inhaled steroids: Secondary | ICD-10-CM | POA: Diagnosis not present

## 2016-03-16 DIAGNOSIS — B9689 Other specified bacterial agents as the cause of diseases classified elsewhere: Secondary | ICD-10-CM | POA: Diagnosis not present

## 2016-03-16 DIAGNOSIS — L603 Nail dystrophy: Secondary | ICD-10-CM | POA: Diagnosis not present

## 2016-03-16 DIAGNOSIS — L89891 Pressure ulcer of other site, stage 1: Secondary | ICD-10-CM | POA: Diagnosis not present

## 2016-03-16 DIAGNOSIS — Z792 Long term (current) use of antibiotics: Secondary | ICD-10-CM | POA: Diagnosis not present

## 2016-03-16 DIAGNOSIS — M961 Postlaminectomy syndrome, not elsewhere classified: Secondary | ICD-10-CM | POA: Diagnosis not present

## 2016-03-20 DIAGNOSIS — B9689 Other specified bacterial agents as the cause of diseases classified elsewhere: Secondary | ICD-10-CM | POA: Diagnosis not present

## 2016-03-20 DIAGNOSIS — Z792 Long term (current) use of antibiotics: Secondary | ICD-10-CM | POA: Diagnosis not present

## 2016-03-20 DIAGNOSIS — Z7951 Long term (current) use of inhaled steroids: Secondary | ICD-10-CM | POA: Diagnosis not present

## 2016-03-20 DIAGNOSIS — M961 Postlaminectomy syndrome, not elsewhere classified: Secondary | ICD-10-CM | POA: Diagnosis not present

## 2016-03-20 DIAGNOSIS — L603 Nail dystrophy: Secondary | ICD-10-CM | POA: Diagnosis not present

## 2016-03-20 DIAGNOSIS — L89891 Pressure ulcer of other site, stage 1: Secondary | ICD-10-CM | POA: Diagnosis not present

## 2016-03-21 ENCOUNTER — Telehealth: Payer: Self-pay
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
## 2016-03-21 NOTE — Telephone Encounter (Signed)
Yes, please treat it with reduction of pressure, colloid dressings, and emmollients.

## 2016-03-21 NOTE — Telephone Encounter (Signed)
Home health nurse called advising pt has a 1x1 pressure ulcer coming up on his heel. Also has rash to the top of right foot and would like to know if you want to treat it. Please advise.

## 2016-03-24 ENCOUNTER — Telehealth: Payer: Self-pay | Admitting: *Deleted

## 2016-03-24 DIAGNOSIS — L89891 Pressure ulcer of other site, stage 1: Secondary | ICD-10-CM | POA: Diagnosis not present

## 2016-03-24 DIAGNOSIS — Z7951 Long term (current) use of inhaled steroids: Secondary | ICD-10-CM | POA: Diagnosis not present

## 2016-03-24 DIAGNOSIS — L603 Nail dystrophy: Secondary | ICD-10-CM | POA: Diagnosis not present

## 2016-03-24 DIAGNOSIS — Z792 Long term (current) use of antibiotics: Secondary | ICD-10-CM | POA: Diagnosis not present

## 2016-03-24 DIAGNOSIS — M961 Postlaminectomy syndrome, not elsewhere classified: Secondary | ICD-10-CM | POA: Diagnosis not present

## 2016-03-24 DIAGNOSIS — B9689 Other specified bacterial agents as the cause of diseases classified elsewhere: Secondary | ICD-10-CM | POA: Diagnosis not present

## 2016-03-24 NOTE — Telephone Encounter (Signed)
Welford Roche from [facility] home health called and wanted to get verbal orders for a wound on the patient's foot. Gave verbal ok to clean wound with saline and xerform with dry dressing which is what the nurse had already done. She also states the patient has abrasions on the right foot. Tammy asks for a verbal order to do skin prep. Gave verbal ok to do skin prep. You probably get a form faxed to you later with these orderers for you to sign

## 2016-03-24 NOTE — Telephone Encounter (Signed)
Wonderful and thank you for giving those verbals for me.  Definitely helps unload [place] during a busy day!

## 2016-03-25 NOTE — Telephone Encounter (Signed)
Ok great.

## 2016-03-27 DIAGNOSIS — L89891 Pressure ulcer of other site, stage 1: Secondary | ICD-10-CM | POA: Diagnosis not present

## 2016-03-27 DIAGNOSIS — M961 Postlaminectomy syndrome, not elsewhere classified: Secondary | ICD-10-CM | POA: Diagnosis not present

## 2016-03-27 DIAGNOSIS — B9689 Other specified bacterial agents as the cause of diseases classified elsewhere: Secondary | ICD-10-CM | POA: Diagnosis not present

## 2016-03-27 DIAGNOSIS — Z7951 Long term (current) use of inhaled steroids: Secondary | ICD-10-CM | POA: Diagnosis not present

## 2016-03-27 DIAGNOSIS — Z792 Long term (current) use of antibiotics: Secondary | ICD-10-CM | POA: Diagnosis not present

## 2016-03-27 DIAGNOSIS — L603 Nail dystrophy: Secondary | ICD-10-CM | POA: Diagnosis not present

## 2016-04-03 DIAGNOSIS — Z7951 Long term (current) use of inhaled steroids: Secondary | ICD-10-CM | POA: Diagnosis not present

## 2016-04-03 DIAGNOSIS — M961 Postlaminectomy syndrome, not elsewhere classified: Secondary | ICD-10-CM | POA: Diagnosis not present

## 2016-04-03 DIAGNOSIS — B9689 Other specified bacterial agents as the cause of diseases classified elsewhere: Secondary | ICD-10-CM | POA: Diagnosis not present

## 2016-04-03 DIAGNOSIS — Z792 Long term (current) use of antibiotics: Secondary | ICD-10-CM | POA: Diagnosis not present

## 2016-04-03 DIAGNOSIS — L603 Nail dystrophy: Secondary | ICD-10-CM | POA: Diagnosis not present

## 2016-04-03 DIAGNOSIS — L89891 Pressure ulcer of other site, stage 1: Secondary | ICD-10-CM | POA: Diagnosis not present

## 2016-04-04 DIAGNOSIS — F3289 Other specified depressive episodes: Secondary | ICD-10-CM | POA: Diagnosis not present

## 2016-04-04 DIAGNOSIS — M961 Postlaminectomy syndrome, not elsewhere classified: Secondary | ICD-10-CM | POA: Diagnosis not present

## 2016-04-04 DIAGNOSIS — M5416 Radiculopathy, lumbar region: Secondary | ICD-10-CM | POA: Diagnosis not present

## 2016-04-04 DIAGNOSIS — Z720 Tobacco use: Secondary | ICD-10-CM | POA: Diagnosis not present

## 2016-04-04 DIAGNOSIS — G894 Chronic pain syndrome: Secondary | ICD-10-CM | POA: Diagnosis not present

## 2016-04-04 DIAGNOSIS — M5417 Radiculopathy, lumbosacral region: Secondary | ICD-10-CM | POA: Diagnosis not present

## 2016-04-09 DIAGNOSIS — B9689 Other specified bacterial agents as the cause of diseases classified elsewhere: Secondary | ICD-10-CM | POA: Diagnosis not present

## 2016-04-09 DIAGNOSIS — Z7951 Long term (current) use of inhaled steroids: Secondary | ICD-10-CM | POA: Diagnosis not present

## 2016-04-09 DIAGNOSIS — M961 Postlaminectomy syndrome, not elsewhere classified: Secondary | ICD-10-CM | POA: Diagnosis not present

## 2016-04-09 DIAGNOSIS — L89891 Pressure ulcer of other site, stage 1: Secondary | ICD-10-CM | POA: Diagnosis not present

## 2016-04-09 DIAGNOSIS — Z792 Long term (current) use of antibiotics: Secondary | ICD-10-CM | POA: Diagnosis not present

## 2016-04-09 DIAGNOSIS — L603 Nail dystrophy: Secondary | ICD-10-CM | POA: Diagnosis not present

## 2016-04-16 DIAGNOSIS — M961 Postlaminectomy syndrome, not elsewhere classified: Secondary | ICD-10-CM | POA: Diagnosis not present

## 2016-04-16 DIAGNOSIS — L603 Nail dystrophy: Secondary | ICD-10-CM | POA: Diagnosis not present

## 2016-04-16 DIAGNOSIS — L89891 Pressure ulcer of other site, stage 1: Secondary | ICD-10-CM | POA: Diagnosis not present

## 2016-04-16 DIAGNOSIS — Z7951 Long term (current) use of inhaled steroids: Secondary | ICD-10-CM | POA: Diagnosis not present

## 2016-04-16 DIAGNOSIS — Z792 Long term (current) use of antibiotics: Secondary | ICD-10-CM | POA: Diagnosis not present

## 2016-04-16 DIAGNOSIS — B9689 Other specified bacterial agents as the cause of diseases classified elsewhere: Secondary | ICD-10-CM | POA: Diagnosis not present

## 2016-04-17 ENCOUNTER — Ambulatory Visit: Payer: Medicare Other | Admitting: Osteopathic Medicine

## 2016-04-18 ENCOUNTER — Ambulatory Visit (INDEPENDENT_AMBULATORY_CARE_PROVIDER_SITE_OTHER): Payer: Medicare Other | Admitting: Sports Medicine

## 2016-04-18 ENCOUNTER — Encounter: Payer: Self-pay | Admitting: Sports Medicine

## 2016-04-18 DIAGNOSIS — L0291 Cutaneous abscess, unspecified: Secondary | ICD-10-CM

## 2016-04-18 MED ORDER — DOXYCYCLINE HYCLATE 100 MG PO TABS: 100.0000 mg | ORAL_TABLET | Freq: Two times a day (BID) | ORAL | 0 refills | Status: DC

## 2016-04-18 MED ORDER — DOXYCYCLINE HYCLATE 100 MG PO TABS: 100.0000 mg | ORAL_TABLET | Freq: Two times a day (BID) | ORAL | 0 refills | Status: AC

## 2016-04-18 MED ORDER — CEFTRIAXONE SODIUM 1 G IJ SOLR
1.0000 g | Freq: Once | INTRAMUSCULAR | Status: AC
  Administered 2016-04-18: 1 g via INTRAMUSCULAR

## 2016-04-18 MED ORDER — METHOCARBAMOL 750 MG PO TABS: 750.0000 mg | ORAL_TABLET | Freq: Three times a day (TID) | ORAL | 0 refills | Status: DC | PRN

## 2016-04-18 NOTE — Addendum Note (Signed)
Addended by: Elizabeth Sauer on: 04/18/2016 03:22 PM   Modules accepted: Orders

## 2016-04-18 NOTE — Progress Notes (Signed)
  Subjective:    CC:  Skin lesion  HPI: This is a 66 year old disabled male, he comes in with a skin lesion on his right buttock, unsure as to whether this represents a pressure sore or a boil. It's moderately painful, and has been present for the past week. Symptoms are moderate, persistent.  Of note he has a multilevel lumbar decompression for lumbar spinal stenosis coming up in 2 weeks.  Past medical history:  Negative.  See flowsheet/record as well for more information.  Surgical history: Negative.  See flowsheet/record as well for more information.  Family history: Negative.  See flowsheet/record as well for more information.  Social history: Negative.  See flowsheet/record as well for more information.  Allergies, and medications have been entered into the medical record, reviewed, and no changes needed.   Review of Systems: No fevers, chills, night sweats, weight loss, chest pain, or shortness of breath.   Objective:    General: Well Developed, well nourished, and in no acute distress.  Neuro: Alert and oriented x3, extra-ocular muscles intact, sensation grossly intact.  HEENT: Normocephalic, atraumatic, pupils equal round reactive to light, neck supple, no masses, no lymphadenopathy, thyroid nonpalpable.  Skin: Warm and dry, no rashes. Cardiac: Regular rate and rhythm, no murmurs rubs or gallops, no lower extremity edema.  Respiratory: Clear to auscultation bilaterally. Not using accessory muscles, speaking in full sentences. Right buttock: There is a minimally tender, warm but nonfluctuant abscess, small, nondraining. No overlying cellulitis.  Impression and Recommendations:    Abscess Abscess on the right gluteus. Rocephin 1 g, doxycycline.  He does have a multilevel lumbar decompressive surgery coming up on Valentine's Day. Requesting Soma for his back, advised I do not prescribe this, he agrees to Robaxin. Return as needed.  I spent 25 minutes with this patient,  greater than 50% was face-to-face time counseling regarding the above diagnoses

## 2016-04-18 NOTE — Assessment & Plan Note (Signed)
Abscess on the right gluteus. Rocephin 1 g, doxycycline.  He does have a multilevel lumbar decompressive surgery coming up on Valentine's Day. Requesting Soma for his back, advised I do not prescribe this, he agrees to Robaxin. Return as needed.

## 2016-04-18 NOTE — Patient Instructions (Signed)
Skin Abscess A skin abscess is an infected area on or under your skin that contains a collection of pus and other material. An abscess may also be called a furuncle, carbuncle, or boil. An abscess can occur in or on almost any part of your body. Some abscesses break open (rupture) on their own. Most continue to get worse unless they are treated. The infection can spread deeper into the body and eventually into your blood, which can make you feel ill. Treatment usually involves draining the abscess. What are the causes? An abscess occurs when germs, often bacteria, pass through your skin and cause an infection. This may be caused by:  A scrape or cut on your skin.  A puncture wound through your skin, including a needle injection.  Blocked oil or sweat glands.  Blocked and infected hair follicles.  A cyst that forms beneath your skin (sebaceous cyst) and becomes infected. What increases the risk? This condition is more likely to develop in people who:  Have a weak body defense system (immune system).  Have diabetes.  Have dry and irritated skin.  Get frequent injections or use illegal IV drugs.  Have a foreign body in a wound, such as a splinter.  Have problems with their lymph system or veins. What are the signs or symptoms? An abscess may start as a painful, firm bump under the skin. Over time, the abscess may get larger or become softer. Pus may appear at the top of the abscess, causing pressure and pain. It may eventually break through the skin and drain. Other symptoms include:  Redness.  Warmth.  Swelling.  Tenderness.  A sore on the skin. How is this diagnosed? This condition is diagnosed based on your medical history and a physical exam. A sample of pus may be taken from the abscess to find out what is causing the infection and what antibiotics can be used to treat it. You also may have:  Blood tests to look for signs of infection or spread of an infection to your  blood.  Imaging studies such as ultrasound, CT scan, or MRI if the abscess is deep. How is this treated? Small abscesses that drain on their own may not need treatment. Treatment for an abscess that does not rupture on its own may include:  Warm compresses applied to the area several times per day.  Incision and drainage. Your health care provider will make an incision to open the abscess and will remove pus and any foreign body or dead tissue. The incision area may be packed with gauze to keep it open for a few days while it heals.  Antibiotic medicines to treat infection. For a severe abscess, you may first get antibiotics through an IV and then change to oral antibiotics. Follow these instructions at home: Abscess Care   If you have an abscess that has not drained, place a warm, clean, wet washcloth over the abscess several times a day. Do this as told by your health care provider.  Follow instructions from your health care provider about how to take care of your abscess. Make sure you:  Cover the abscess with a bandage (dressing).  Change your dressing or gauze as told by your health care provider.  Wash your hands with soap and water before you change the dressing or gauze. If soap and water are not available, use hand sanitizer.  Check your abscess every day for signs of a worsening infection. Check for:  More redness, swelling, or   pain.  More fluid or blood.  Warmth.  More pus or a bad smell. Medicines   Take over-the-counter and prescription medicines only as told by your health care provider.  If you were prescribed an antibiotic medicine, take it as told by your health care provider. Do not stop taking the antibiotic even if you start to feel better. General instructions   To avoid spreading the infection:  Do not share personal care items, towels, or hot tubs with others.  Avoid making skin contact with other people.  Keep all follow-up visits as told by your  health care provider. This is important. Contact a health care provider if:  You have more redness, swelling, or pain around your abscess.  You have more fluid or blood coming from your abscess.  Your abscess feels warm to the touch.  You have more pus or a bad smell coming from your abscess.  You have a fever.  You have muscle aches.  You have chills or a general ill feeling. Get help right away if:  You have severe pain.  You see red streaks on your skin spreading away from the abscess. This information is not intended to replace advice given to you by your health care provider. Make sure you discuss any questions you have with your health care provider. Document Released: 12/11/2004 Document Revised: 10/28/2015 Document Reviewed: 01/10/2015 Elsevier Interactive Patient Education  2017 [employer]  

## 2016-04-23 DIAGNOSIS — B9689 Other specified bacterial agents as the cause of diseases classified elsewhere: Secondary | ICD-10-CM | POA: Diagnosis not present

## 2016-04-23 DIAGNOSIS — Z7951 Long term (current) use of inhaled steroids: Secondary | ICD-10-CM | POA: Diagnosis not present

## 2016-04-23 DIAGNOSIS — L603 Nail dystrophy: Secondary | ICD-10-CM | POA: Diagnosis not present

## 2016-04-23 DIAGNOSIS — L89891 Pressure ulcer of other site, stage 1: Secondary | ICD-10-CM | POA: Diagnosis not present

## 2016-04-23 DIAGNOSIS — Z792 Long term (current) use of antibiotics: Secondary | ICD-10-CM | POA: Diagnosis not present

## 2016-04-23 DIAGNOSIS — M961 Postlaminectomy syndrome, not elsewhere classified: Secondary | ICD-10-CM | POA: Diagnosis not present

## 2016-04-29 DIAGNOSIS — K219 Gastro-esophageal reflux disease without esophagitis: Secondary | ICD-10-CM | POA: Diagnosis not present

## 2016-04-29 DIAGNOSIS — Z792 Long term (current) use of antibiotics: Secondary | ICD-10-CM | POA: Diagnosis not present

## 2016-04-29 DIAGNOSIS — Z7951 Long term (current) use of inhaled steroids: Secondary | ICD-10-CM | POA: Diagnosis not present

## 2016-04-29 DIAGNOSIS — G8929 Other chronic pain: Secondary | ICD-10-CM | POA: Diagnosis not present

## 2016-04-29 DIAGNOSIS — L89891 Pressure ulcer of other site, stage 1: Secondary | ICD-10-CM | POA: Diagnosis not present

## 2016-04-29 DIAGNOSIS — E785 Hyperlipidemia, unspecified: Secondary | ICD-10-CM | POA: Diagnosis not present

## 2016-04-29 DIAGNOSIS — M48061 Spinal stenosis, lumbar region without neurogenic claudication: Secondary | ICD-10-CM | POA: Diagnosis not present

## 2016-04-29 DIAGNOSIS — M48062 Spinal stenosis, lumbar region with neurogenic claudication: Secondary | ICD-10-CM | POA: Diagnosis not present

## 2016-04-29 DIAGNOSIS — L603 Nail dystrophy: Secondary | ICD-10-CM | POA: Diagnosis not present

## 2016-04-29 DIAGNOSIS — M5442 Lumbago with sciatica, left side: Secondary | ICD-10-CM | POA: Diagnosis not present

## 2016-04-29 DIAGNOSIS — B9689 Other specified bacterial agents as the cause of diseases classified elsewhere: Secondary | ICD-10-CM | POA: Diagnosis not present

## 2016-04-29 DIAGNOSIS — Z01812 Encounter for preprocedural laboratory examination: Secondary | ICD-10-CM | POA: Diagnosis not present

## 2016-04-29 DIAGNOSIS — F17200 Nicotine dependence, unspecified, uncomplicated: Secondary | ICD-10-CM | POA: Diagnosis not present

## 2016-04-29 DIAGNOSIS — I10 Essential (primary) hypertension: Secondary | ICD-10-CM | POA: Diagnosis not present

## 2016-04-29 DIAGNOSIS — M961 Postlaminectomy syndrome, not elsewhere classified: Secondary | ICD-10-CM | POA: Diagnosis not present

## 2016-04-30 DIAGNOSIS — M48062 Spinal stenosis, lumbar region with neurogenic claudication: Secondary | ICD-10-CM | POA: Diagnosis not present

## 2016-04-30 DIAGNOSIS — M7918 Myalgia, other site: Secondary | ICD-10-CM | POA: Insufficient documentation

## 2016-04-30 DIAGNOSIS — M48061 Spinal stenosis, lumbar region without neurogenic claudication: Secondary | ICD-10-CM | POA: Diagnosis not present

## 2016-05-02 DIAGNOSIS — M48062 Spinal stenosis, lumbar region with neurogenic claudication: Secondary | ICD-10-CM | POA: Diagnosis not present

## 2016-05-02 DIAGNOSIS — R339 Retention of urine, unspecified: Secondary | ICD-10-CM | POA: Diagnosis not present

## 2016-05-02 DIAGNOSIS — L603 Nail dystrophy: Secondary | ICD-10-CM | POA: Diagnosis not present

## 2016-05-02 DIAGNOSIS — M961 Postlaminectomy syndrome, not elsewhere classified: Secondary | ICD-10-CM | POA: Diagnosis not present

## 2016-05-02 DIAGNOSIS — L89610 Pressure ulcer of right heel, unstageable: Secondary | ICD-10-CM | POA: Diagnosis not present

## 2016-05-02 DIAGNOSIS — K219 Gastro-esophageal reflux disease without esophagitis: Secondary | ICD-10-CM | POA: Diagnosis not present

## 2016-05-02 DIAGNOSIS — F329 Major depressive disorder, single episode, unspecified: Secondary | ICD-10-CM | POA: Diagnosis not present

## 2016-05-02 DIAGNOSIS — Z4789 Encounter for other orthopedic aftercare: Secondary | ICD-10-CM | POA: Diagnosis not present

## 2016-05-02 DIAGNOSIS — Z87828 Personal history of other (healed) physical injury and trauma: Secondary | ICD-10-CM | POA: Diagnosis not present

## 2016-05-02 DIAGNOSIS — S81801D Unspecified open wound, right lower leg, subsequent encounter: Secondary | ICD-10-CM | POA: Diagnosis not present

## 2016-05-02 DIAGNOSIS — F17290 Nicotine dependence, other tobacco product, uncomplicated: Secondary | ICD-10-CM | POA: Diagnosis not present

## 2016-05-02 DIAGNOSIS — M549 Dorsalgia, unspecified: Secondary | ICD-10-CM | POA: Diagnosis not present

## 2016-05-02 DIAGNOSIS — Z7951 Long term (current) use of inhaled steroids: Secondary | ICD-10-CM | POA: Diagnosis not present

## 2016-05-02 DIAGNOSIS — E785 Hyperlipidemia, unspecified: Secondary | ICD-10-CM | POA: Diagnosis not present

## 2016-05-02 DIAGNOSIS — Z791 Long term (current) use of non-steroidal anti-inflammatories (NSAID): Secondary | ICD-10-CM | POA: Diagnosis not present

## 2016-05-02 DIAGNOSIS — G894 Chronic pain syndrome: Secondary | ICD-10-CM | POA: Diagnosis not present

## 2016-05-02 DIAGNOSIS — L89891 Pressure ulcer of other site, stage 1: Secondary | ICD-10-CM | POA: Diagnosis not present

## 2016-05-02 DIAGNOSIS — E669 Obesity, unspecified: Secondary | ICD-10-CM | POA: Diagnosis not present

## 2016-05-02 DIAGNOSIS — M5136 Other intervertebral disc degeneration, lumbar region: Secondary | ICD-10-CM | POA: Diagnosis not present

## 2016-05-02 DIAGNOSIS — G608 Other hereditary and idiopathic neuropathies: Secondary | ICD-10-CM | POA: Diagnosis not present

## 2016-05-02 DIAGNOSIS — I10 Essential (primary) hypertension: Secondary | ICD-10-CM | POA: Diagnosis not present

## 2016-05-02 DIAGNOSIS — N401 Enlarged prostate with lower urinary tract symptoms: Secondary | ICD-10-CM | POA: Diagnosis not present

## 2016-05-02 DIAGNOSIS — Z23 Encounter for immunization: Secondary | ICD-10-CM | POA: Diagnosis not present

## 2016-05-02 DIAGNOSIS — Z6828 Body mass index (BMI) 28.0-28.9, adult: Secondary | ICD-10-CM | POA: Diagnosis not present

## 2016-05-02 DIAGNOSIS — M5117 Intervertebral disc disorders with radiculopathy, lumbosacral region: Secondary | ICD-10-CM | POA: Diagnosis not present

## 2016-05-02 DIAGNOSIS — K5909 Other constipation: Secondary | ICD-10-CM | POA: Diagnosis not present

## 2016-05-02 DIAGNOSIS — R2689 Other abnormalities of gait and mobility: Secondary | ICD-10-CM | POA: Diagnosis not present

## 2016-05-02 DIAGNOSIS — R197 Diarrhea, unspecified: Secondary | ICD-10-CM | POA: Diagnosis not present

## 2016-05-02 DIAGNOSIS — Z79899 Other long term (current) drug therapy: Secondary | ICD-10-CM | POA: Diagnosis not present

## 2016-05-15 DIAGNOSIS — Z7951 Long term (current) use of inhaled steroids: Secondary | ICD-10-CM | POA: Diagnosis not present

## 2016-05-15 DIAGNOSIS — M961 Postlaminectomy syndrome, not elsewhere classified: Secondary | ICD-10-CM | POA: Diagnosis not present

## 2016-05-15 DIAGNOSIS — L603 Nail dystrophy: Secondary | ICD-10-CM | POA: Diagnosis not present

## 2016-05-15 DIAGNOSIS — S81801D Unspecified open wound, right lower leg, subsequent encounter: Secondary | ICD-10-CM | POA: Diagnosis not present

## 2016-05-15 DIAGNOSIS — L89610 Pressure ulcer of right heel, unstageable: Secondary | ICD-10-CM | POA: Diagnosis not present

## 2016-05-15 DIAGNOSIS — L89891 Pressure ulcer of other site, stage 1: Secondary | ICD-10-CM | POA: Diagnosis not present

## 2016-05-16 ENCOUNTER — Telehealth: Payer: Self-pay | Admitting: Sports Medicine

## 2016-05-16 NOTE — Telephone Encounter (Signed)
Verbal order given  

## 2016-05-16 NOTE — Telephone Encounter (Signed)
Elizabeth from [facility] called to get a verbal for the following:  Restart HHm weekly for 6 weeks. Wound care: pressure ulcer and surgical incision PT/OT: eval & treat  Routing to Provider.

## 2016-05-16 NOTE — Telephone Encounter (Signed)
Left VM with verbal. 

## 2016-05-19 DIAGNOSIS — L603 Nail dystrophy: Secondary | ICD-10-CM | POA: Diagnosis not present

## 2016-05-19 DIAGNOSIS — L89891 Pressure ulcer of other site, stage 1: Secondary | ICD-10-CM | POA: Diagnosis not present

## 2016-05-19 DIAGNOSIS — S81801D Unspecified open wound, right lower leg, subsequent encounter: Secondary | ICD-10-CM | POA: Diagnosis not present

## 2016-05-19 DIAGNOSIS — L89610 Pressure ulcer of right heel, unstageable: Secondary | ICD-10-CM | POA: Diagnosis not present

## 2016-05-19 DIAGNOSIS — M961 Postlaminectomy syndrome, not elsewhere classified: Secondary | ICD-10-CM | POA: Diagnosis not present

## 2016-05-19 DIAGNOSIS — Z7951 Long term (current) use of inhaled steroids: Secondary | ICD-10-CM | POA: Diagnosis not present

## 2016-05-20 DIAGNOSIS — L89610 Pressure ulcer of right heel, unstageable: Secondary | ICD-10-CM | POA: Diagnosis not present

## 2016-05-20 DIAGNOSIS — S81801D Unspecified open wound, right lower leg, subsequent encounter: Secondary | ICD-10-CM | POA: Diagnosis not present

## 2016-05-20 DIAGNOSIS — L603 Nail dystrophy: Secondary | ICD-10-CM | POA: Diagnosis not present

## 2016-05-20 DIAGNOSIS — Z7951 Long term (current) use of inhaled steroids: Secondary | ICD-10-CM | POA: Diagnosis not present

## 2016-05-20 DIAGNOSIS — M961 Postlaminectomy syndrome, not elsewhere classified: Secondary | ICD-10-CM | POA: Diagnosis not present

## 2016-05-20 DIAGNOSIS — L89891 Pressure ulcer of other site, stage 1: Secondary | ICD-10-CM | POA: Diagnosis not present

## 2016-05-22 DIAGNOSIS — L89891 Pressure ulcer of other site, stage 1: Secondary | ICD-10-CM | POA: Diagnosis not present

## 2016-05-22 DIAGNOSIS — S81801D Unspecified open wound, right lower leg, subsequent encounter: Secondary | ICD-10-CM | POA: Diagnosis not present

## 2016-05-22 DIAGNOSIS — M961 Postlaminectomy syndrome, not elsewhere classified: Secondary | ICD-10-CM | POA: Diagnosis not present

## 2016-05-22 DIAGNOSIS — L89610 Pressure ulcer of right heel, unstageable: Secondary | ICD-10-CM | POA: Diagnosis not present

## 2016-05-22 DIAGNOSIS — L603 Nail dystrophy: Secondary | ICD-10-CM | POA: Diagnosis not present

## 2016-05-22 DIAGNOSIS — Z7951 Long term (current) use of inhaled steroids: Secondary | ICD-10-CM | POA: Diagnosis not present

## 2016-05-23 DIAGNOSIS — L89610 Pressure ulcer of right heel, unstageable: Secondary | ICD-10-CM | POA: Diagnosis not present

## 2016-05-23 DIAGNOSIS — Z7951 Long term (current) use of inhaled steroids: Secondary | ICD-10-CM | POA: Diagnosis not present

## 2016-05-23 DIAGNOSIS — L89891 Pressure ulcer of other site, stage 1: Secondary | ICD-10-CM | POA: Diagnosis not present

## 2016-05-23 DIAGNOSIS — M961 Postlaminectomy syndrome, not elsewhere classified: Secondary | ICD-10-CM | POA: Diagnosis not present

## 2016-05-23 DIAGNOSIS — S81801D Unspecified open wound, right lower leg, subsequent encounter: Secondary | ICD-10-CM | POA: Diagnosis not present

## 2016-05-23 DIAGNOSIS — L603 Nail dystrophy: Secondary | ICD-10-CM | POA: Diagnosis not present

## 2016-05-27 DIAGNOSIS — L89610 Pressure ulcer of right heel, unstageable: Secondary | ICD-10-CM | POA: Diagnosis not present

## 2016-05-27 DIAGNOSIS — M5416 Radiculopathy, lumbar region: Secondary | ICD-10-CM | POA: Diagnosis not present

## 2016-05-27 DIAGNOSIS — M961 Postlaminectomy syndrome, not elsewhere classified: Secondary | ICD-10-CM | POA: Diagnosis not present

## 2016-05-27 DIAGNOSIS — Z720 Tobacco use: Secondary | ICD-10-CM | POA: Diagnosis not present

## 2016-05-27 DIAGNOSIS — L89891 Pressure ulcer of other site, stage 1: Secondary | ICD-10-CM | POA: Diagnosis not present

## 2016-05-27 DIAGNOSIS — L603 Nail dystrophy: Secondary | ICD-10-CM | POA: Diagnosis not present

## 2016-05-27 DIAGNOSIS — Z7951 Long term (current) use of inhaled steroids: Secondary | ICD-10-CM | POA: Diagnosis not present

## 2016-05-27 DIAGNOSIS — G894 Chronic pain syndrome: Secondary | ICD-10-CM | POA: Diagnosis not present

## 2016-05-27 DIAGNOSIS — S81801D Unspecified open wound, right lower leg, subsequent encounter: Secondary | ICD-10-CM | POA: Diagnosis not present

## 2016-05-27 DIAGNOSIS — F3289 Other specified depressive episodes: Secondary | ICD-10-CM | POA: Diagnosis not present

## 2016-05-27 DIAGNOSIS — I10 Essential (primary) hypertension: Secondary | ICD-10-CM | POA: Diagnosis not present

## 2016-05-29 DIAGNOSIS — S81801D Unspecified open wound, right lower leg, subsequent encounter: Secondary | ICD-10-CM | POA: Diagnosis not present

## 2016-05-29 DIAGNOSIS — L89610 Pressure ulcer of right heel, unstageable: Secondary | ICD-10-CM | POA: Diagnosis not present

## 2016-05-29 DIAGNOSIS — Z7951 Long term (current) use of inhaled steroids: Secondary | ICD-10-CM | POA: Diagnosis not present

## 2016-05-29 DIAGNOSIS — L89891 Pressure ulcer of other site, stage 1: Secondary | ICD-10-CM | POA: Diagnosis not present

## 2016-05-29 DIAGNOSIS — L603 Nail dystrophy: Secondary | ICD-10-CM | POA: Diagnosis not present

## 2016-05-29 DIAGNOSIS — M961 Postlaminectomy syndrome, not elsewhere classified: Secondary | ICD-10-CM | POA: Diagnosis not present

## 2016-05-30 DIAGNOSIS — L89891 Pressure ulcer of other site, stage 1: Secondary | ICD-10-CM | POA: Diagnosis not present

## 2016-05-30 DIAGNOSIS — S81801D Unspecified open wound, right lower leg, subsequent encounter: Secondary | ICD-10-CM | POA: Diagnosis not present

## 2016-05-30 DIAGNOSIS — Z7951 Long term (current) use of inhaled steroids: Secondary | ICD-10-CM | POA: Diagnosis not present

## 2016-05-30 DIAGNOSIS — M961 Postlaminectomy syndrome, not elsewhere classified: Secondary | ICD-10-CM | POA: Diagnosis not present

## 2016-05-30 DIAGNOSIS — L89610 Pressure ulcer of right heel, unstageable: Secondary | ICD-10-CM | POA: Diagnosis not present

## 2016-05-30 DIAGNOSIS — L603 Nail dystrophy: Secondary | ICD-10-CM | POA: Diagnosis not present

## 2016-05-31 ENCOUNTER — Encounter: Payer: Self-pay | Admitting: Emergency Medicine

## 2016-05-31 ENCOUNTER — Emergency Department (INDEPENDENT_AMBULATORY_CARE_PROVIDER_SITE_OTHER)
Admission: EM | Admit: 2016-05-31 | Discharge: 2016-05-31 | Disposition: A | Discharge status: Home / Self Care | Payer: Medicare Other | Source: Home / Self Care | Attending: Family Medicine | Admitting: Family Medicine

## 2016-05-31 DIAGNOSIS — N509 Disorder of male genital organs, unspecified: Secondary | ICD-10-CM | POA: Diagnosis not present

## 2016-05-31 DIAGNOSIS — N5089 Other specified disorders of the male genital organs: Secondary | ICD-10-CM

## 2016-05-31 DIAGNOSIS — M62838 Other muscle spasm: Secondary | ICD-10-CM

## 2016-05-31 MED ORDER — MUPIROCIN 2 % EX OINT: 1.0000 "application " | TOPICAL_OINTMENT | Freq: Three times a day (TID) | CUTANEOUS | 0 refills | Status: DC

## 2016-05-31 MED ORDER — DOXYCYCLINE HYCLATE 100 MG PO CAPS: 100.0000 mg | ORAL_CAPSULE | Freq: Two times a day (BID) | ORAL | 0 refills | Status: DC

## 2016-05-31 MED ORDER — BACLOFEN 10 MG PO TABS: ORAL_TABLET | ORAL | 0 refills | Status: DC

## 2016-05-31 NOTE — Discharge Instructions (Addendum)
Begin warm Sitz bath once or twice daily (or apply heat pack)
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
## 2016-05-31 NOTE — ED Triage Notes (Signed)
Patient reports noticing lump posterior to testicles over past 4-5 days with some bloody discharge. Occasionally painful; spends most of day in wheelchair due to spinal surgery last month; can stand with aid of cane/walker. Has been taking his usual pain medication.

## 2016-06-02 DIAGNOSIS — L89891 Pressure ulcer of other site, stage 1: Secondary | ICD-10-CM | POA: Diagnosis not present

## 2016-06-02 DIAGNOSIS — S81801D Unspecified open wound, right lower leg, subsequent encounter: Secondary | ICD-10-CM | POA: Diagnosis not present

## 2016-06-02 DIAGNOSIS — L89610 Pressure ulcer of right heel, unstageable: Secondary | ICD-10-CM | POA: Diagnosis not present

## 2016-06-02 DIAGNOSIS — L603 Nail dystrophy: Secondary | ICD-10-CM | POA: Diagnosis not present

## 2016-06-02 DIAGNOSIS — M961 Postlaminectomy syndrome, not elsewhere classified: Secondary | ICD-10-CM | POA: Diagnosis not present

## 2016-06-02 DIAGNOSIS — Z7951 Long term (current) use of inhaled steroids: Secondary | ICD-10-CM | POA: Diagnosis not present

## 2016-06-03 DIAGNOSIS — L89891 Pressure ulcer of other site, stage 1: Secondary | ICD-10-CM | POA: Diagnosis not present

## 2016-06-03 DIAGNOSIS — S81801D Unspecified open wound, right lower leg, subsequent encounter: Secondary | ICD-10-CM | POA: Diagnosis not present

## 2016-06-03 DIAGNOSIS — Z7951 Long term (current) use of inhaled steroids: Secondary | ICD-10-CM | POA: Diagnosis not present

## 2016-06-03 DIAGNOSIS — L89610 Pressure ulcer of right heel, unstageable: Secondary | ICD-10-CM | POA: Diagnosis not present

## 2016-06-03 DIAGNOSIS — M961 Postlaminectomy syndrome, not elsewhere classified: Secondary | ICD-10-CM | POA: Diagnosis not present

## 2016-06-03 DIAGNOSIS — L603 Nail dystrophy: Secondary | ICD-10-CM | POA: Diagnosis not present

## 2016-06-04 DIAGNOSIS — Z7951 Long term (current) use of inhaled steroids: Secondary | ICD-10-CM | POA: Diagnosis not present

## 2016-06-04 DIAGNOSIS — S81801D Unspecified open wound, right lower leg, subsequent encounter: Secondary | ICD-10-CM | POA: Diagnosis not present

## 2016-06-04 DIAGNOSIS — M961 Postlaminectomy syndrome, not elsewhere classified: Secondary | ICD-10-CM | POA: Diagnosis not present

## 2016-06-04 DIAGNOSIS — L603 Nail dystrophy: Secondary | ICD-10-CM | POA: Diagnosis not present

## 2016-06-04 DIAGNOSIS — L89610 Pressure ulcer of right heel, unstageable: Secondary | ICD-10-CM | POA: Diagnosis not present

## 2016-06-04 DIAGNOSIS — L89891 Pressure ulcer of other site, stage 1: Secondary | ICD-10-CM | POA: Diagnosis not present

## 2016-06-05 DIAGNOSIS — L89610 Pressure ulcer of right heel, unstageable: Secondary | ICD-10-CM | POA: Diagnosis not present

## 2016-06-05 DIAGNOSIS — L89891 Pressure ulcer of other site, stage 1: Secondary | ICD-10-CM | POA: Diagnosis not present

## 2016-06-05 DIAGNOSIS — S81801D Unspecified open wound, right lower leg, subsequent encounter: Secondary | ICD-10-CM | POA: Diagnosis not present

## 2016-06-05 DIAGNOSIS — Z7951 Long term (current) use of inhaled steroids: Secondary | ICD-10-CM | POA: Diagnosis not present

## 2016-06-05 DIAGNOSIS — M961 Postlaminectomy syndrome, not elsewhere classified: Secondary | ICD-10-CM | POA: Diagnosis not present

## 2016-06-05 DIAGNOSIS — L603 Nail dystrophy: Secondary | ICD-10-CM | POA: Diagnosis not present

## 2016-06-06 DIAGNOSIS — L603 Nail dystrophy: Secondary | ICD-10-CM | POA: Diagnosis not present

## 2016-06-06 DIAGNOSIS — M961 Postlaminectomy syndrome, not elsewhere classified: Secondary | ICD-10-CM | POA: Diagnosis not present

## 2016-06-06 DIAGNOSIS — S81801D Unspecified open wound, right lower leg, subsequent encounter: Secondary | ICD-10-CM | POA: Diagnosis not present

## 2016-06-06 DIAGNOSIS — L89891 Pressure ulcer of other site, stage 1: Secondary | ICD-10-CM | POA: Diagnosis not present

## 2016-06-06 DIAGNOSIS — L89610 Pressure ulcer of right heel, unstageable: Secondary | ICD-10-CM | POA: Diagnosis not present

## 2016-06-06 DIAGNOSIS — Z7951 Long term (current) use of inhaled steroids: Secondary | ICD-10-CM | POA: Diagnosis not present

## 2016-06-10 DIAGNOSIS — L89610 Pressure ulcer of right heel, unstageable: Secondary | ICD-10-CM | POA: Diagnosis not present

## 2016-06-10 DIAGNOSIS — L603 Nail dystrophy: Secondary | ICD-10-CM | POA: Diagnosis not present

## 2016-06-10 DIAGNOSIS — L89891 Pressure ulcer of other site, stage 1: Secondary | ICD-10-CM | POA: Diagnosis not present

## 2016-06-10 DIAGNOSIS — Z7951 Long term (current) use of inhaled steroids: Secondary | ICD-10-CM | POA: Diagnosis not present

## 2016-06-10 DIAGNOSIS — S81801D Unspecified open wound, right lower leg, subsequent encounter: Secondary | ICD-10-CM | POA: Diagnosis not present

## 2016-06-10 DIAGNOSIS — M961 Postlaminectomy syndrome, not elsewhere classified: Secondary | ICD-10-CM | POA: Diagnosis not present

## 2016-06-11 DIAGNOSIS — Z7951 Long term (current) use of inhaled steroids: Secondary | ICD-10-CM | POA: Diagnosis not present

## 2016-06-11 DIAGNOSIS — L89610 Pressure ulcer of right heel, unstageable: Secondary | ICD-10-CM | POA: Diagnosis not present

## 2016-06-11 DIAGNOSIS — M961 Postlaminectomy syndrome, not elsewhere classified: Secondary | ICD-10-CM | POA: Diagnosis not present

## 2016-06-11 DIAGNOSIS — L89891 Pressure ulcer of other site, stage 1: Secondary | ICD-10-CM | POA: Diagnosis not present

## 2016-06-11 DIAGNOSIS — L603 Nail dystrophy: Secondary | ICD-10-CM | POA: Diagnosis not present

## 2016-06-11 DIAGNOSIS — S81801D Unspecified open wound, right lower leg, subsequent encounter: Secondary | ICD-10-CM | POA: Diagnosis not present

## 2016-06-12 DIAGNOSIS — M961 Postlaminectomy syndrome, not elsewhere classified: Secondary | ICD-10-CM | POA: Diagnosis not present

## 2016-06-12 DIAGNOSIS — Z7951 Long term (current) use of inhaled steroids: Secondary | ICD-10-CM | POA: Diagnosis not present

## 2016-06-12 DIAGNOSIS — S81801D Unspecified open wound, right lower leg, subsequent encounter: Secondary | ICD-10-CM | POA: Diagnosis not present

## 2016-06-12 DIAGNOSIS — L603 Nail dystrophy: Secondary | ICD-10-CM | POA: Diagnosis not present

## 2016-06-12 DIAGNOSIS — L89891 Pressure ulcer of other site, stage 1: Secondary | ICD-10-CM | POA: Diagnosis not present

## 2016-06-12 DIAGNOSIS — L89610 Pressure ulcer of right heel, unstageable: Secondary | ICD-10-CM | POA: Diagnosis not present

## 2016-06-13 DIAGNOSIS — M961 Postlaminectomy syndrome, not elsewhere classified: Secondary | ICD-10-CM | POA: Diagnosis not present

## 2016-06-13 DIAGNOSIS — S81801D Unspecified open wound, right lower leg, subsequent encounter: Secondary | ICD-10-CM | POA: Diagnosis not present

## 2016-06-13 DIAGNOSIS — Z7951 Long term (current) use of inhaled steroids: Secondary | ICD-10-CM | POA: Diagnosis not present

## 2016-06-13 DIAGNOSIS — L603 Nail dystrophy: Secondary | ICD-10-CM | POA: Diagnosis not present

## 2016-06-13 DIAGNOSIS — L89891 Pressure ulcer of other site, stage 1: Secondary | ICD-10-CM | POA: Diagnosis not present

## 2016-06-13 DIAGNOSIS — L89610 Pressure ulcer of right heel, unstageable: Secondary | ICD-10-CM | POA: Diagnosis not present

## 2016-06-17 DIAGNOSIS — L89891 Pressure ulcer of other site, stage 1: Secondary | ICD-10-CM | POA: Diagnosis not present

## 2016-06-17 DIAGNOSIS — L603 Nail dystrophy: Secondary | ICD-10-CM | POA: Diagnosis not present

## 2016-06-17 DIAGNOSIS — S81801D Unspecified open wound, right lower leg, subsequent encounter: Secondary | ICD-10-CM | POA: Diagnosis not present

## 2016-06-17 DIAGNOSIS — Z7951 Long term (current) use of inhaled steroids: Secondary | ICD-10-CM | POA: Diagnosis not present

## 2016-06-17 DIAGNOSIS — M961 Postlaminectomy syndrome, not elsewhere classified: Secondary | ICD-10-CM | POA: Diagnosis not present

## 2016-06-17 DIAGNOSIS — L89610 Pressure ulcer of right heel, unstageable: Secondary | ICD-10-CM | POA: Diagnosis not present

## 2016-06-18 DIAGNOSIS — S81801D Unspecified open wound, right lower leg, subsequent encounter: Secondary | ICD-10-CM | POA: Diagnosis not present

## 2016-06-18 DIAGNOSIS — M961 Postlaminectomy syndrome, not elsewhere classified: Secondary | ICD-10-CM | POA: Diagnosis not present

## 2016-06-18 DIAGNOSIS — L89610 Pressure ulcer of right heel, unstageable: Secondary | ICD-10-CM | POA: Diagnosis not present

## 2016-06-18 DIAGNOSIS — Z7951 Long term (current) use of inhaled steroids: Secondary | ICD-10-CM | POA: Diagnosis not present

## 2016-06-18 DIAGNOSIS — L603 Nail dystrophy: Secondary | ICD-10-CM | POA: Diagnosis not present

## 2016-06-18 DIAGNOSIS — L89891 Pressure ulcer of other site, stage 1: Secondary | ICD-10-CM | POA: Diagnosis not present

## 2016-06-19 DIAGNOSIS — M961 Postlaminectomy syndrome, not elsewhere classified: Secondary | ICD-10-CM | POA: Diagnosis not present

## 2016-06-19 DIAGNOSIS — L89891 Pressure ulcer of other site, stage 1: Secondary | ICD-10-CM | POA: Diagnosis not present

## 2016-06-19 DIAGNOSIS — L89610 Pressure ulcer of right heel, unstageable: Secondary | ICD-10-CM | POA: Diagnosis not present

## 2016-06-19 DIAGNOSIS — S81801D Unspecified open wound, right lower leg, subsequent encounter: Secondary | ICD-10-CM | POA: Diagnosis not present

## 2016-06-19 DIAGNOSIS — Z7951 Long term (current) use of inhaled steroids: Secondary | ICD-10-CM | POA: Diagnosis not present

## 2016-06-19 DIAGNOSIS — L603 Nail dystrophy: Secondary | ICD-10-CM | POA: Diagnosis not present

## 2016-06-20 DIAGNOSIS — L603 Nail dystrophy: Secondary | ICD-10-CM | POA: Diagnosis not present

## 2016-06-20 DIAGNOSIS — Z7951 Long term (current) use of inhaled steroids: Secondary | ICD-10-CM | POA: Diagnosis not present

## 2016-06-20 DIAGNOSIS — S81801D Unspecified open wound, right lower leg, subsequent encounter: Secondary | ICD-10-CM | POA: Diagnosis not present

## 2016-06-20 DIAGNOSIS — L89891 Pressure ulcer of other site, stage 1: Secondary | ICD-10-CM | POA: Diagnosis not present

## 2016-06-20 DIAGNOSIS — L89610 Pressure ulcer of right heel, unstageable: Secondary | ICD-10-CM | POA: Diagnosis not present

## 2016-06-20 DIAGNOSIS — M961 Postlaminectomy syndrome, not elsewhere classified: Secondary | ICD-10-CM | POA: Diagnosis not present

## 2016-06-23 DIAGNOSIS — M961 Postlaminectomy syndrome, not elsewhere classified: Secondary | ICD-10-CM | POA: Diagnosis not present

## 2016-06-23 DIAGNOSIS — L89610 Pressure ulcer of right heel, unstageable: Secondary | ICD-10-CM | POA: Diagnosis not present

## 2016-06-23 DIAGNOSIS — Z7951 Long term (current) use of inhaled steroids: Secondary | ICD-10-CM | POA: Diagnosis not present

## 2016-06-23 DIAGNOSIS — L603 Nail dystrophy: Secondary | ICD-10-CM | POA: Diagnosis not present

## 2016-06-23 DIAGNOSIS — L89891 Pressure ulcer of other site, stage 1: Secondary | ICD-10-CM | POA: Diagnosis not present

## 2016-06-23 DIAGNOSIS — S81801D Unspecified open wound, right lower leg, subsequent encounter: Secondary | ICD-10-CM | POA: Diagnosis not present

## 2016-06-24 DIAGNOSIS — G894 Chronic pain syndrome: Secondary | ICD-10-CM | POA: Diagnosis not present

## 2016-06-24 DIAGNOSIS — Z7951 Long term (current) use of inhaled steroids: Secondary | ICD-10-CM | POA: Diagnosis not present

## 2016-06-24 DIAGNOSIS — Z79899 Other long term (current) drug therapy: Secondary | ICD-10-CM | POA: Diagnosis not present

## 2016-06-24 DIAGNOSIS — S81801D Unspecified open wound, right lower leg, subsequent encounter: Secondary | ICD-10-CM | POA: Diagnosis not present

## 2016-06-24 DIAGNOSIS — L89610 Pressure ulcer of right heel, unstageable: Secondary | ICD-10-CM | POA: Diagnosis not present

## 2016-06-24 DIAGNOSIS — L89891 Pressure ulcer of other site, stage 1: Secondary | ICD-10-CM | POA: Diagnosis not present

## 2016-06-24 DIAGNOSIS — M5416 Radiculopathy, lumbar region: Secondary | ICD-10-CM | POA: Diagnosis not present

## 2016-06-24 DIAGNOSIS — M5417 Radiculopathy, lumbosacral region: Secondary | ICD-10-CM | POA: Diagnosis not present

## 2016-06-24 DIAGNOSIS — Z720 Tobacco use: Secondary | ICD-10-CM | POA: Diagnosis not present

## 2016-06-24 DIAGNOSIS — M961 Postlaminectomy syndrome, not elsewhere classified: Secondary | ICD-10-CM | POA: Diagnosis not present

## 2016-06-24 DIAGNOSIS — Z5181 Encounter for therapeutic drug level monitoring: Secondary | ICD-10-CM | POA: Diagnosis not present

## 2016-06-24 DIAGNOSIS — F3289 Other specified depressive episodes: Secondary | ICD-10-CM | POA: Diagnosis not present

## 2016-06-24 DIAGNOSIS — I10 Essential (primary) hypertension: Secondary | ICD-10-CM | POA: Diagnosis not present

## 2016-06-24 DIAGNOSIS — L603 Nail dystrophy: Secondary | ICD-10-CM | POA: Diagnosis not present

## 2016-06-25 DIAGNOSIS — S81801D Unspecified open wound, right lower leg, subsequent encounter: Secondary | ICD-10-CM | POA: Diagnosis not present

## 2016-06-25 DIAGNOSIS — M961 Postlaminectomy syndrome, not elsewhere classified: Secondary | ICD-10-CM | POA: Diagnosis not present

## 2016-06-25 DIAGNOSIS — L89891 Pressure ulcer of other site, stage 1: Secondary | ICD-10-CM | POA: Diagnosis not present

## 2016-06-25 DIAGNOSIS — L89610 Pressure ulcer of right heel, unstageable: Secondary | ICD-10-CM | POA: Diagnosis not present

## 2016-06-25 DIAGNOSIS — L603 Nail dystrophy: Secondary | ICD-10-CM | POA: Diagnosis not present

## 2016-06-25 DIAGNOSIS — Z7951 Long term (current) use of inhaled steroids: Secondary | ICD-10-CM | POA: Diagnosis not present

## 2016-06-26 DIAGNOSIS — S81801D Unspecified open wound, right lower leg, subsequent encounter: Secondary | ICD-10-CM | POA: Diagnosis not present

## 2016-06-26 DIAGNOSIS — L603 Nail dystrophy: Secondary | ICD-10-CM | POA: Diagnosis not present

## 2016-06-26 DIAGNOSIS — L89610 Pressure ulcer of right heel, unstageable: Secondary | ICD-10-CM | POA: Diagnosis not present

## 2016-06-26 DIAGNOSIS — Z7951 Long term (current) use of inhaled steroids: Secondary | ICD-10-CM | POA: Diagnosis not present

## 2016-06-26 DIAGNOSIS — L89891 Pressure ulcer of other site, stage 1: Secondary | ICD-10-CM | POA: Diagnosis not present

## 2016-06-26 DIAGNOSIS — M961 Postlaminectomy syndrome, not elsewhere classified: Secondary | ICD-10-CM | POA: Diagnosis not present

## 2016-06-27 DIAGNOSIS — L89610 Pressure ulcer of right heel, unstageable: Secondary | ICD-10-CM | POA: Diagnosis not present

## 2016-06-27 DIAGNOSIS — L89891 Pressure ulcer of other site, stage 1: Secondary | ICD-10-CM | POA: Diagnosis not present

## 2016-06-27 DIAGNOSIS — L603 Nail dystrophy: Secondary | ICD-10-CM | POA: Diagnosis not present

## 2016-06-27 DIAGNOSIS — Z7951 Long term (current) use of inhaled steroids: Secondary | ICD-10-CM | POA: Diagnosis not present

## 2016-06-27 DIAGNOSIS — S81801D Unspecified open wound, right lower leg, subsequent encounter: Secondary | ICD-10-CM | POA: Diagnosis not present

## 2016-06-27 DIAGNOSIS — M961 Postlaminectomy syndrome, not elsewhere classified: Secondary | ICD-10-CM | POA: Diagnosis not present

## 2016-07-22 DIAGNOSIS — I10 Essential (primary) hypertension: Secondary | ICD-10-CM | POA: Diagnosis not present

## 2016-07-22 DIAGNOSIS — Z720 Tobacco use: Secondary | ICD-10-CM | POA: Diagnosis not present

## 2016-07-22 DIAGNOSIS — F3289 Other specified depressive episodes: Secondary | ICD-10-CM | POA: Diagnosis not present

## 2016-07-22 DIAGNOSIS — M5416 Radiculopathy, lumbar region: Secondary | ICD-10-CM | POA: Diagnosis not present

## 2016-07-22 DIAGNOSIS — G894 Chronic pain syndrome: Secondary | ICD-10-CM | POA: Diagnosis not present

## 2016-07-22 DIAGNOSIS — M79604 Pain in right leg: Secondary | ICD-10-CM | POA: Diagnosis not present

## 2016-08-21 ENCOUNTER — Ambulatory Visit: Payer: Medicare Other | Admitting: Osteopathic Medicine

## 2016-09-04 ENCOUNTER — Ambulatory Visit (INDEPENDENT_AMBULATORY_CARE_PROVIDER_SITE_OTHER): Payer: Medicare Other | Admitting: Osteopathic Medicine

## 2016-09-04 ENCOUNTER — Encounter: Payer: Self-pay | Admitting: Osteopathic Medicine

## 2016-09-04 VITALS — BP 151/75 | HR 93 | Ht 74.0 in

## 2016-09-04 DIAGNOSIS — F419 Anxiety disorder, unspecified: Secondary | ICD-10-CM | POA: Diagnosis not present

## 2016-09-04 DIAGNOSIS — J310 Chronic rhinitis: Secondary | ICD-10-CM

## 2016-09-04 DIAGNOSIS — I10 Essential (primary) hypertension: Secondary | ICD-10-CM

## 2016-09-04 DIAGNOSIS — M5416 Radiculopathy, lumbar region: Secondary | ICD-10-CM

## 2016-09-04 DIAGNOSIS — M62838 Other muscle spasm: Secondary | ICD-10-CM | POA: Diagnosis not present

## 2016-09-04 DIAGNOSIS — K591 Functional diarrhea: Secondary | ICD-10-CM | POA: Diagnosis not present

## 2016-09-04 DIAGNOSIS — M961 Postlaminectomy syndrome, not elsewhere classified: Secondary | ICD-10-CM

## 2016-09-04 MED ORDER — METHOCARBAMOL 750 MG PO TABS: 750.0000 mg | ORAL_TABLET | Freq: Three times a day (TID) | ORAL | 1 refills | Status: DC | PRN

## 2016-09-04 MED ORDER — BACLOFEN 10 MG PO TABS: 10.0000 mg | ORAL_TABLET | Freq: Every evening | ORAL | 1 refills | Status: DC | PRN

## 2016-09-04 NOTE — Patient Instructions (Signed)
PLAN:  Continue primary care and chronic care at the [facility]  If you have an urgent matter (new injury, rash, feeling sick) can come see me  If you have a chronic issue (insomnia, pain, etc) please see your doctor at the [place]   This separation keeps your records in one place and keeps one doctor in charge of your major medical issues - this is for patient safety purposes!  Every time you go to the [place], please have the send [place] an updated list of your medications.

## 2016-09-04 NOTE — Progress Notes (Signed)
HPI: John Scott is a 66 y.o. male  who presents to [facility] today, 09/04/16,  for chief complaint of:  Chief Complaint  Patient presents with  . Other    SWITCH FROM HOMMEL/ MEDICATION REFILL    Patient is seen at the [place]. Medication list as below is a mess. Patient is not sure or is not clear on what he is or is not taking. Previous provider in this office was refilling medications but we do not have recent records from the [place]. Patient has multiple medical problems which are briefly reviewed as below:  Hypertension: Patient states that this has been better since surgery, medical assistant is with him today and states that blood pressures typically running in the 140s at home. Patient has not followed up for some time with his primary doctor at the [facility]  Lumbar radiculopathy: Patient is on opiate pain medications, he is also taking muscle relaxers alternating back and forth between baclofen and methocarbamol "so I don't get tolerant to either one of them." He requests refills on these medications today until he can get seen with the [place].  Left lower extremity abrasion: Patient concerned about darkening of the skin, questions whether he is healing.  Functional diarrhea: Patient states happens on and off, requests refill of Lomotil to use as needed.  History of anxiety: Patient requests refill of Valium  Past medical history, surgical history, social history and family history reviewed.  Patient Active Problem List   Diagnosis Date Noted  . Pressure sore 02/29/2016  . 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 Essential hypertension 10/11/2014  . Lumbar radiculopathy 05/22/2014  . Left leg pain 12/12/2011  . Left leg weakness 12/12/2011  . Nasal congestion 10/20/2011  . Abscess 10/20/2011  . Functional diarrhea 06/10/2010  . Toe abrasion, infected 06/10/2010  . HYPERLIPIDEMIA 12/16/2007  . SOB 10/07/2007  . Postlaminectomy syndrome 09/17/2007  .  CONSTIPATION 09/16/2007  . BENIGN PROSTATIC HYPERTROPHY, WITH URINARY OBSTRUCTION 06/28/2007  . CHRONIC RHINITIS 05/27/2007  . LEG EDEMA 05/27/2007  . DEPRESSION 02/10/2007  . SPONDYLOSIS, LUMBOSACRAL 02/10/2007  . SPINAL STENOSIS 02/10/2007    Current medication list and allergy/intolerance information reviewed.  Confirmed to the best of my ability but see intake notes for full details. Multiple unknown medications. Current Outpatient Prescriptions on File Prior to Visit  Medication Sig Dispense Refill  . FLUoxetine (PROZAC) 20 MG capsule Take 2 capsules (40 mg total) by mouth daily. 60 capsule 2  . ibuprofen (ADVIL,MOTRIN) 800 MG tablet Take 800 mg by mouth every 8 (eight) hours as needed.    . diazepam (VALIUM) 10 MG tablet One by mouth 1-3 times a day only as needed for anxiety. 14 tablet 0  . diphenoxylate-atropine (LOMOTIL) 2.5-0.025 MG tablet Take 1 tablet by mouth 4 (four) times daily as needed for diarrhea or loose stools. (Patient not taking: Reported on 09/04/2016) 30 tablet 2  . docusate sodium (COLACE) 100 MG capsule Take 100 mg by mouth as needed.      . fexofenadine (ALLEGRA) 180 MG tablet Take 180 mg by mouth daily.      . fluticasone (FLONASE) 50 MCG/ACT nasal spray Two sprays each nostril twice a day. 16 g 6  . lisinopril-hydrochlorothiazide (ZESTORETIC) 20-12.5 MG tablet Take 1 tablet by mouth daily. (Patient not taking: Reported on 09/04/2016) 90 tablet 3  . mirtazapine (REMERON) 30 MG tablet Take 1 tablet (30 mg total) by mouth at bedtime. 30 tablet 3  . mupirocin ointment (BACTROBAN) 2 %  Apply 1 application topically 3 (three) times daily. 30 g 0  . omeprazole (PRILOSEC) 20 MG capsule Take 20 mg by mouth daily.      . Sennosides (SENNA LAXATIVE) 25 MG TABS Take by mouth 3 (three) times daily.      . traZODone (DESYREL) 100 MG tablet Take 100 mg by mouth at bedtime. Take 5 po qhs as directed     . triprolidine-pseudoephedrine (APRODINE) 2.5-60 MG TABS Take 1 tablet by  mouth 2 (two) times daily. 56 tablet 0  . [DISCONTINUED] ciclesonide (OMNARIS) 50 MCG/ACT nasal spray 2 sprays by Each Nare route daily.       No current facility-administered medications on file prior to visit.    Allergies  Allergen Reactions  . Baclofen     REACTION: vertigo - when he takes it tid  . Nucynta [Tapentadol]     hallucinations      Review of Systems:  Constitutional: No recent illness  HEENT: No  headache, no vision change  Cardiac: No  chest pain, No  pressure, No palpitations  Respiratory:  No  shortness of breath. No  Cough  Gastrointestinal: No  abdominal pain  Musculoskeletal: No new myalgia/arthralgia  Skin: +Rash  Neurologic: No  weakness, No  Dizziness  Psychiatric: No  concerns with depression, +concerns with anxiety  Exam:  BP (!) 151/75   Pulse 93   Ht 6\' 2"  (1.88 m)   Constitutional: VS see above. General Appearance: alert, well-developed, well-nourished, NAD  Eyes: Normal lids and conjunctive, non-icteric sclera  Ears, Nose, Mouth, Throat: MMM, Normal external inspection ears/nares/mouth/lips/gums.  Neck: No masses, trachea midline.   Respiratory: Normal respiratory effort. no wheeze, no rhonchi, no rales  Cardiovascular: S1/S2 normal, no murmur, no rub/gallop auscultated. RRR. Trace lower extremity edema, nonpitting  Musculoskeletal: Gait not examined, patient in wheelchair. Symmetric and independent movement of all extremities  Neurological: Normal balance/coordination. No tremor.  Skin: warm, dry, intact. Healing abrasion on dorsum of left foot  Psychiatric: Normal judgment/insight. Normal mood and affect. Oriented x3.     ASSESSMENT/PLAN: The primary encounter diagnosis was Essential hypertension. Diagnoses of Chronic rhinitis, Muscle spasm, Lumbar radiculopathy, Postlaminectomy syndrome, Anxiety, and Functional diarrhea were also pertinent to this visit.   Made it very clear to the patient that his primary care doctor is  at the [place] and I will not be providing primary care. I understand that the [place] can be difficult to get seen in a timely manner for urgent need so I am happy to see him for acute complaints since I am familiar with his overall medical history but I will not be prescribing long-term medications for him, particularly since there is not good communication with the [place] with regard to what medicines he is on, he does not have pill bottles with him, etc. Gave limited refill of muscle relaxers for 30 days, needs to follow-up with PCP. Patient states that he has an appointment within the next 6 weeks, I asked him to confirm this when he gets home. No additional refills of chronic medications for me.  Patient Instructions  PLAN:  Continue primary care and chronic care at the [facility]  If you have an urgent matter (new injury, rash, feeling sick) can come see me  If you have a chronic issue (insomnia, pain, etc) please see your doctor at the [place]   This separation keeps your records in one place and keeps one doctor in charge of your major medical issues - this is for  patient safety purposes!  Every time you go to the [place], please have the send [place] an updated list of your medications.     Follow-up plan: Return for urgent care as needed if [place] doctor cannot see you .  Visit summary with medication list and pertinent instructions was printed for patient to review, alert [place] if any changes needed. All questions at time of visit were answered - patient instructed to contact office with any additional concerns. ER/RTC precautions were reviewed with the patient and understanding verbalized.   Note: Total time spent 40 minutes, greater than 50% of the visit was spent face-to-face counseling and coordinating care for the following: The primary encounter diagnosis was Essential hypertension. Diagnoses of Chronic rhinitis, Muscle spasm, Lumbar radiculopathy, Postlaminectomy syndrome, Anxiety, and Functional diarrhea were also  pertinent to this visit.[name]

## 2016-09-08 ENCOUNTER — Telehealth: Payer: Self-pay | Admitting: Osteopathic Medicine

## 2016-09-08 NOTE — Telephone Encounter (Signed)
Received fax for prior auth on Methocarbamol sent through cover my meds waiting on authorization. - CF

## 2016-09-09 NOTE — Telephone Encounter (Signed)
Received fax from Medicare and they denied coverage on Methocarbam because the use is not supported by the FDA or by one of the medicare approved references. - CF

## 2016-09-10 NOTE — Telephone Encounter (Signed)
Okay, as this is a chronic medication that is being managed by the [place], I would encourage the patient to address this issue with his PCP there

## 2016-09-25 DIAGNOSIS — M5416 Radiculopathy, lumbar region: Secondary | ICD-10-CM | POA: Diagnosis not present

## 2016-09-25 DIAGNOSIS — G894 Chronic pain syndrome: Secondary | ICD-10-CM | POA: Diagnosis not present

## 2016-09-25 DIAGNOSIS — Z720 Tobacco use: Secondary | ICD-10-CM | POA: Diagnosis not present

## 2016-09-25 DIAGNOSIS — M5417 Radiculopathy, lumbosacral region: Secondary | ICD-10-CM | POA: Diagnosis not present

## 2016-09-25 DIAGNOSIS — F3289 Other specified depressive episodes: Secondary | ICD-10-CM | POA: Diagnosis not present

## 2016-09-25 DIAGNOSIS — I10 Essential (primary) hypertension: Secondary | ICD-10-CM | POA: Diagnosis not present

## 2016-10-06 ENCOUNTER — Other Ambulatory Visit: Payer: Self-pay

## 2016-10-23 DIAGNOSIS — Z720 Tobacco use: Secondary | ICD-10-CM | POA: Diagnosis not present

## 2016-10-23 DIAGNOSIS — M961 Postlaminectomy syndrome, not elsewhere classified: Secondary | ICD-10-CM | POA: Diagnosis not present

## 2016-10-23 DIAGNOSIS — G894 Chronic pain syndrome: Secondary | ICD-10-CM | POA: Diagnosis not present

## 2016-10-23 DIAGNOSIS — K219 Gastro-esophageal reflux disease without esophagitis: Secondary | ICD-10-CM | POA: Diagnosis not present

## 2016-10-23 DIAGNOSIS — F3289 Other specified depressive episodes: Secondary | ICD-10-CM | POA: Diagnosis not present

## 2016-10-23 DIAGNOSIS — M5417 Radiculopathy, lumbosacral region: Secondary | ICD-10-CM | POA: Diagnosis not present

## 2016-10-23 DIAGNOSIS — E785 Hyperlipidemia, unspecified: Secondary | ICD-10-CM | POA: Diagnosis not present

## 2016-10-23 DIAGNOSIS — I10 Essential (primary) hypertension: Secondary | ICD-10-CM | POA: Diagnosis not present

## 2016-10-23 DIAGNOSIS — F329 Major depressive disorder, single episode, unspecified: Secondary | ICD-10-CM | POA: Diagnosis not present

## 2016-10-23 DIAGNOSIS — F17200 Nicotine dependence, unspecified, uncomplicated: Secondary | ICD-10-CM | POA: Diagnosis not present

## 2016-10-23 DIAGNOSIS — M5416 Radiculopathy, lumbar region: Secondary | ICD-10-CM | POA: Diagnosis not present

## 2016-12-18 DIAGNOSIS — F3289 Other specified depressive episodes: Secondary | ICD-10-CM | POA: Diagnosis not present

## 2016-12-18 DIAGNOSIS — I10 Essential (primary) hypertension: Secondary | ICD-10-CM | POA: Diagnosis not present

## 2016-12-18 DIAGNOSIS — M549 Dorsalgia, unspecified: Secondary | ICD-10-CM | POA: Diagnosis not present

## 2016-12-18 DIAGNOSIS — M5416 Radiculopathy, lumbar region: Secondary | ICD-10-CM | POA: Diagnosis not present

## 2016-12-18 DIAGNOSIS — Z5181 Encounter for therapeutic drug level monitoring: Secondary | ICD-10-CM | POA: Diagnosis not present

## 2016-12-18 DIAGNOSIS — Z79899 Other long term (current) drug therapy: Secondary | ICD-10-CM | POA: Diagnosis not present

## 2016-12-18 DIAGNOSIS — Z720 Tobacco use: Secondary | ICD-10-CM | POA: Diagnosis not present

## 2016-12-18 DIAGNOSIS — M5442 Lumbago with sciatica, left side: Secondary | ICD-10-CM | POA: Diagnosis not present

## 2016-12-18 DIAGNOSIS — G894 Chronic pain syndrome: Secondary | ICD-10-CM | POA: Diagnosis not present

## 2017-01-06 ENCOUNTER — Telehealth: Payer: Self-pay | Admitting: Family Medicine

## 2017-01-06 NOTE — Telephone Encounter (Signed)
Patient called request to know if you will accept him back as your patient. He misunderstood a couple years ago and thought you started working part time and changed to [place] due to that but I adv him that every provider has 1/2 day in our office and he said oh I misunderstood if I would of known that I wouldn't of ever switched. Pt states he does go to [facility] for majority of his care but if he gets sick he has to drive all the way to [facility] emergency room and wait for a very long time. Pt stated he probably would be coming in once or twice a year. Please adv so I can call pt back. Thanks

## 2017-01-07 NOTE — Telephone Encounter (Signed)
I would be happy to see him back.  You can go ahead and change the header back to me as the PCP.  Thank you.

## 2017-01-07 NOTE — Telephone Encounter (Signed)
Okay great. I will call him and let him know. Thank you

## 2017-01-13 ENCOUNTER — Ambulatory Visit: Payer: Medicare Other | Admitting: Family Medicine

## 2017-01-15 ENCOUNTER — Ambulatory Visit: Payer: Medicare Other | Admitting: Family Medicine

## 2017-01-15 ENCOUNTER — Encounter: Payer: Self-pay | Admitting: Family Medicine

## 2017-01-15 ENCOUNTER — Ambulatory Visit (INDEPENDENT_AMBULATORY_CARE_PROVIDER_SITE_OTHER): Payer: Medicare Other | Admitting: Family Medicine

## 2017-01-15 VITALS — BP 130/72 | HR 65 | Ht 73.0 in | Wt 241.0 lb

## 2017-01-15 DIAGNOSIS — F5101 Primary insomnia: Secondary | ICD-10-CM | POA: Diagnosis not present

## 2017-01-15 DIAGNOSIS — Z23 Encounter for immunization: Secondary | ICD-10-CM | POA: Diagnosis not present

## 2017-01-15 DIAGNOSIS — M62838 Other muscle spasm: Secondary | ICD-10-CM | POA: Diagnosis not present

## 2017-01-15 MED ORDER — KETOROLAC TROMETHAMINE 60 MG/2ML IM SOLN
30.0000 mg | Freq: Once | INTRAMUSCULAR | Status: AC
  Administered 2017-01-15: 30 mg via INTRAMUSCULAR

## 2017-01-15 MED ORDER — BACLOFEN 10 MG PO TABS: 10.0000 mg | ORAL_TABLET | Freq: Every evening | ORAL | 1 refills | Status: DC | PRN

## 2017-01-15 NOTE — Patient Instructions (Addendum)
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
## 2017-01-16 ENCOUNTER — Telehealth: Payer: Self-pay | Admitting: *Deleted

## 2017-01-16 NOTE — Telephone Encounter (Signed)
Pt called and lvm stating that he forgot to inform Dr. Madilyn Fireman yesterday while going over his medication list that he takes percocet 10 mg #120 monthly.Maryruth Eve, [place]ahoma Crocker

## 2017-01-29 DIAGNOSIS — M48061 Spinal stenosis, lumbar region without neurogenic claudication: Secondary | ICD-10-CM | POA: Diagnosis not present

## 2017-01-29 DIAGNOSIS — M79605 Pain in left leg: Secondary | ICD-10-CM | POA: Diagnosis not present

## 2017-01-29 DIAGNOSIS — Z9889 Other specified postprocedural states: Secondary | ICD-10-CM | POA: Diagnosis not present

## 2017-01-29 DIAGNOSIS — M5146 Schmorl's nodes, lumbar region: Secondary | ICD-10-CM | POA: Diagnosis not present

## 2017-01-29 DIAGNOSIS — M5136 Other intervertebral disc degeneration, lumbar region: Secondary | ICD-10-CM | POA: Diagnosis not present

## 2017-01-29 DIAGNOSIS — M5137 Other intervertebral disc degeneration, lumbosacral region: Secondary | ICD-10-CM | POA: Diagnosis not present

## 2017-01-29 DIAGNOSIS — M47896 Other spondylosis, lumbar region: Secondary | ICD-10-CM | POA: Diagnosis not present

## 2017-01-29 DIAGNOSIS — I10 Essential (primary) hypertension: Secondary | ICD-10-CM | POA: Diagnosis not present

## 2017-02-12 ENCOUNTER — Telehealth: Payer: Self-pay

## 2017-02-12 NOTE — Telephone Encounter (Signed)
John Scott called and state he is going to postpone getting a colonoscopy for 6 months. Because he is taking pain medications the prep is to drink the liquid for 2 days. He is unable to go to the bathroom multiple times for 2 days due to the fact he still has trouble walking.

## 2017-02-17 DIAGNOSIS — M549 Dorsalgia, unspecified: Secondary | ICD-10-CM | POA: Diagnosis not present

## 2017-02-17 DIAGNOSIS — Z720 Tobacco use: Secondary | ICD-10-CM | POA: Diagnosis not present

## 2017-02-17 DIAGNOSIS — M5416 Radiculopathy, lumbar region: Secondary | ICD-10-CM | POA: Diagnosis not present

## 2017-02-17 DIAGNOSIS — G894 Chronic pain syndrome: Secondary | ICD-10-CM | POA: Diagnosis not present

## 2017-02-17 DIAGNOSIS — F3289 Other specified depressive episodes: Secondary | ICD-10-CM | POA: Diagnosis not present

## 2017-02-17 DIAGNOSIS — I10 Essential (primary) hypertension: Secondary | ICD-10-CM | POA: Diagnosis not present

## 2017-03-19 DIAGNOSIS — M961 Postlaminectomy syndrome, not elsewhere classified: Secondary | ICD-10-CM | POA: Diagnosis not present

## 2017-03-19 DIAGNOSIS — G894 Chronic pain syndrome: Secondary | ICD-10-CM | POA: Diagnosis not present

## 2017-03-19 DIAGNOSIS — J309 Allergic rhinitis, unspecified: Secondary | ICD-10-CM | POA: Diagnosis not present

## 2017-03-19 DIAGNOSIS — E785 Hyperlipidemia, unspecified: Secondary | ICD-10-CM | POA: Diagnosis not present

## 2017-03-19 DIAGNOSIS — M5417 Radiculopathy, lumbosacral region: Secondary | ICD-10-CM | POA: Diagnosis not present

## 2017-03-19 DIAGNOSIS — M4727 Other spondylosis with radiculopathy, lumbosacral region: Secondary | ICD-10-CM | POA: Diagnosis not present

## 2017-03-19 DIAGNOSIS — Z79899 Other long term (current) drug therapy: Secondary | ICD-10-CM | POA: Diagnosis not present

## 2017-03-19 DIAGNOSIS — N401 Enlarged prostate with lower urinary tract symptoms: Secondary | ICD-10-CM | POA: Diagnosis not present

## 2017-03-19 DIAGNOSIS — I10 Essential (primary) hypertension: Secondary | ICD-10-CM | POA: Diagnosis not present

## 2017-04-14 ENCOUNTER — Other Ambulatory Visit: Payer: Self-pay | Admitting: *Deleted

## 2017-04-14 DIAGNOSIS — M5416 Radiculopathy, lumbar region: Secondary | ICD-10-CM | POA: Diagnosis not present

## 2017-04-14 DIAGNOSIS — M79604 Pain in right leg: Secondary | ICD-10-CM | POA: Diagnosis not present

## 2017-04-14 DIAGNOSIS — Z720 Tobacco use: Secondary | ICD-10-CM | POA: Diagnosis not present

## 2017-04-14 DIAGNOSIS — I10 Essential (primary) hypertension: Secondary | ICD-10-CM | POA: Diagnosis not present

## 2017-04-14 DIAGNOSIS — F3289 Other specified depressive episodes: Secondary | ICD-10-CM | POA: Diagnosis not present

## 2017-04-14 DIAGNOSIS — M62838 Other muscle spasm: Secondary | ICD-10-CM

## 2017-04-14 DIAGNOSIS — M549 Dorsalgia, unspecified: Secondary | ICD-10-CM | POA: Diagnosis not present

## 2017-04-14 MED ORDER — BACLOFEN 10 MG PO TABS: 10.0000 mg | ORAL_TABLET | Freq: Every evening | ORAL | 0 refills | Status: DC | PRN

## 2017-04-16 ENCOUNTER — Ambulatory Visit (INDEPENDENT_AMBULATORY_CARE_PROVIDER_SITE_OTHER): Payer: Medicare Other | Admitting: Sports Medicine

## 2017-04-16 ENCOUNTER — Encounter: Payer: Self-pay | Admitting: Sports Medicine

## 2017-04-16 DIAGNOSIS — L739 Follicular disorder, unspecified: Secondary | ICD-10-CM

## 2017-04-16 MED ORDER — AMBULATORY NON FORMULARY MEDICATION: 0 refills | Status: AC

## 2017-04-16 MED ORDER — DOXYCYCLINE HYCLATE 100 MG PO TABS: 100.0000 mg | ORAL_TABLET | Freq: Two times a day (BID) | ORAL | 0 refills | Status: DC

## 2017-04-16 MED ORDER — DOXYCYCLINE HYCLATE 100 MG PO TABS: 100.0000 mg | ORAL_TABLET | Freq: Two times a day (BID) | ORAL | 0 refills | Status: AC

## 2017-04-16 NOTE — Addendum Note (Signed)
Addended by: Silverio Decamp on: 04/16/2017 02:57 PM   Modules accepted: Orders

## 2017-04-16 NOTE — Assessment & Plan Note (Signed)
Right medial buttock adjacent to anus, no evidence of ischioanal abscess. This is very minor, adding a course of doxycycline, return as needed.

## 2017-04-16 NOTE — Addendum Note (Signed)
Addended by: Elizabeth Sauer on: 04/16/2017 02:54 PM   Modules accepted: Orders
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
## 2017-05-28 DIAGNOSIS — G894 Chronic pain syndrome: Secondary | ICD-10-CM | POA: Diagnosis not present

## 2017-05-28 DIAGNOSIS — F17210 Nicotine dependence, cigarettes, uncomplicated: Secondary | ICD-10-CM | POA: Diagnosis not present

## 2017-05-28 DIAGNOSIS — M5116 Intervertebral disc disorders with radiculopathy, lumbar region: Secondary | ICD-10-CM | POA: Diagnosis not present

## 2017-05-28 DIAGNOSIS — M48061 Spinal stenosis, lumbar region without neurogenic claudication: Secondary | ICD-10-CM | POA: Diagnosis not present

## 2017-05-28 DIAGNOSIS — M5417 Radiculopathy, lumbosacral region: Secondary | ICD-10-CM | POA: Diagnosis not present

## 2017-06-11 DIAGNOSIS — I10 Essential (primary) hypertension: Secondary | ICD-10-CM | POA: Diagnosis not present

## 2017-06-11 DIAGNOSIS — G894 Chronic pain syndrome: Secondary | ICD-10-CM | POA: Diagnosis not present

## 2017-06-11 DIAGNOSIS — F3289 Other specified depressive episodes: Secondary | ICD-10-CM | POA: Diagnosis not present

## 2017-06-11 DIAGNOSIS — Z720 Tobacco use: Secondary | ICD-10-CM | POA: Diagnosis not present

## 2017-06-11 DIAGNOSIS — M5416 Radiculopathy, lumbar region: Secondary | ICD-10-CM | POA: Diagnosis not present

## 2017-06-11 DIAGNOSIS — M549 Dorsalgia, unspecified: Secondary | ICD-10-CM | POA: Diagnosis not present

## 2017-07-16 ENCOUNTER — Ambulatory Visit: Payer: Medicare Other | Admitting: Family Medicine

## 2017-07-27 ENCOUNTER — Ambulatory Visit: Payer: Medicare Other | Admitting: Family Medicine

## 2017-08-06 DIAGNOSIS — F3289 Other specified depressive episodes: Secondary | ICD-10-CM | POA: Diagnosis not present

## 2017-08-06 DIAGNOSIS — M5417 Radiculopathy, lumbosacral region: Secondary | ICD-10-CM | POA: Diagnosis not present

## 2017-08-06 DIAGNOSIS — M79604 Pain in right leg: Secondary | ICD-10-CM | POA: Diagnosis not present

## 2017-08-06 DIAGNOSIS — Z5181 Encounter for therapeutic drug level monitoring: Secondary | ICD-10-CM | POA: Diagnosis not present

## 2017-08-06 DIAGNOSIS — M549 Dorsalgia, unspecified: Secondary | ICD-10-CM | POA: Diagnosis not present

## 2017-08-06 DIAGNOSIS — M5416 Radiculopathy, lumbar region: Secondary | ICD-10-CM | POA: Diagnosis not present

## 2017-08-06 DIAGNOSIS — Z79899 Other long term (current) drug therapy: Secondary | ICD-10-CM | POA: Diagnosis not present

## 2017-08-06 DIAGNOSIS — G894 Chronic pain syndrome: Secondary | ICD-10-CM | POA: Diagnosis not present

## 2017-08-20 DIAGNOSIS — M5417 Radiculopathy, lumbosacral region: Secondary | ICD-10-CM | POA: Diagnosis not present

## 2017-08-24 ENCOUNTER — Other Ambulatory Visit: Payer: Self-pay | Admitting: Family Medicine

## 2017-08-24 DIAGNOSIS — M62838 Other muscle spasm: Secondary | ICD-10-CM

## 2017-09-04 ENCOUNTER — Ambulatory Visit: Payer: Medicare Other | Admitting: Family Medicine

## 2017-09-04 NOTE — Progress Notes (Unsigned)
Subjective:    CC: BP  HPI:  Hypertension- Pt denies chest pain, SOB, dizziness, or heart palpitations.  Taking meds as directed w/o problems.  Denies medication side effects.    He still follows with Dr. Mechele Dawley for chronic pain management.   Past medical history, Surgical history, Family history not pertinant except as noted below, Social history, Allergies, and medications have been entered into the medical record, reviewed, and corrections made.   Review of Systems: No fevers, chills, night sweats, weight loss, chest pain, or shortness of breath.   Objective:    General: Well Developed, well nourished, and in no acute distress.  Neuro: Alert and oriented x3, extra-ocular muscles intact, sensation grossly intact.  HEENT: Normocephalic, atraumatic  Skin: Warm and dry, no rashes. Cardiac: Regular rate and rhythm, no murmurs rubs or gallops, no lower extremity edema.  Respiratory: Clear to auscultation bilaterally. Not using accessory muscles, speaking in full sentences.   Impression and Recommendations:    HTN -

## 2017-09-07 ENCOUNTER — Telehealth: Payer: Self-pay

## 2017-09-07 DIAGNOSIS — M62838 Other muscle spasm: Secondary | ICD-10-CM

## 2017-09-07 NOTE — Telephone Encounter (Signed)
Pt left msg- he has appt scheduled on 10-02-17 and wants to know if he can have some baclofen called in to hold him over to appt.   Pt was late to his last appt that was scheduled on 09-04-17.  Please advise if appropriate  Thanks!

## 2017-09-08 MED ORDER — BACLOFEN 10 MG PO TABS: 10.0000 mg | ORAL_TABLET | Freq: Every evening | ORAL | 0 refills | Status: DC | PRN

## 2017-09-08 NOTE — Telephone Encounter (Signed)
Med refilled for 30 days.  ?

## 2017-09-09 NOTE — Telephone Encounter (Signed)
Pt advised RX was sent in and advised him to make sur to keep upcoming appt

## 2017-09-09 NOTE — Telephone Encounter (Signed)
Attempted to contact Pt to advise, phone number was busy.

## 2017-10-02 ENCOUNTER — Ambulatory Visit (INDEPENDENT_AMBULATORY_CARE_PROVIDER_SITE_OTHER): Payer: Medicare Other | Admitting: Family Medicine

## 2017-10-02 ENCOUNTER — Encounter: Payer: Self-pay | Admitting: Family Medicine

## 2017-10-02 VITALS — BP 137/81 | HR 81 | Ht 73.0 in | Wt 224.0 lb

## 2017-10-02 DIAGNOSIS — Z1211 Encounter for screening for malignant neoplasm of colon: Secondary | ICD-10-CM | POA: Diagnosis not present

## 2017-10-02 DIAGNOSIS — I10 Essential (primary) hypertension: Secondary | ICD-10-CM | POA: Diagnosis not present

## 2017-10-02 DIAGNOSIS — Z23 Encounter for immunization: Secondary | ICD-10-CM

## 2017-10-02 DIAGNOSIS — M47817 Spondylosis without myelopathy or radiculopathy, lumbosacral region: Secondary | ICD-10-CM

## 2017-10-02 DIAGNOSIS — Z720 Tobacco use: Secondary | ICD-10-CM

## 2017-10-02 DIAGNOSIS — Z13220 Encounter for screening for lipoid disorders: Secondary | ICD-10-CM | POA: Diagnosis not present

## 2017-10-02 DIAGNOSIS — M5416 Radiculopathy, lumbar region: Secondary | ICD-10-CM

## 2017-10-02 DIAGNOSIS — Z125 Encounter for screening for malignant neoplasm of prostate: Secondary | ICD-10-CM

## 2017-10-02 MED ORDER — KETOROLAC TROMETHAMINE 60 MG/2ML IM SOLN
60.0000 mg | Freq: Once | INTRAMUSCULAR | Status: AC
  Administered 2017-10-02: 60 mg via INTRAMUSCULAR
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
## 2017-10-15 DIAGNOSIS — F3289 Other specified depressive episodes: Secondary | ICD-10-CM | POA: Diagnosis not present

## 2017-10-15 DIAGNOSIS — M5416 Radiculopathy, lumbar region: Secondary | ICD-10-CM | POA: Diagnosis not present

## 2017-10-15 DIAGNOSIS — M549 Dorsalgia, unspecified: Secondary | ICD-10-CM | POA: Diagnosis not present

## 2017-10-15 DIAGNOSIS — M5417 Radiculopathy, lumbosacral region: Secondary | ICD-10-CM | POA: Diagnosis not present

## 2017-10-15 DIAGNOSIS — G894 Chronic pain syndrome: Secondary | ICD-10-CM | POA: Diagnosis not present

## 2017-10-15 DIAGNOSIS — M79604 Pain in right leg: Secondary | ICD-10-CM | POA: Diagnosis not present

## 2017-11-01 ENCOUNTER — Other Ambulatory Visit: Payer: Self-pay | Admitting: Family Medicine

## 2017-11-01 DIAGNOSIS — M62838 Other muscle spasm: Secondary | ICD-10-CM

## 2017-11-05 DIAGNOSIS — M5417 Radiculopathy, lumbosacral region: Secondary | ICD-10-CM | POA: Diagnosis not present

## 2017-11-05 DIAGNOSIS — K219 Gastro-esophageal reflux disease without esophagitis: Secondary | ICD-10-CM | POA: Diagnosis not present

## 2017-11-05 DIAGNOSIS — I10 Essential (primary) hypertension: Secondary | ICD-10-CM | POA: Diagnosis not present

## 2017-11-05 DIAGNOSIS — F17290 Nicotine dependence, other tobacco product, uncomplicated: Secondary | ICD-10-CM | POA: Diagnosis not present

## 2017-11-05 DIAGNOSIS — F329 Major depressive disorder, single episode, unspecified: Secondary | ICD-10-CM | POA: Diagnosis not present

## 2017-11-05 DIAGNOSIS — F419 Anxiety disorder, unspecified: Secondary | ICD-10-CM | POA: Diagnosis not present

## 2017-11-06 ENCOUNTER — Other Ambulatory Visit: Payer: Self-pay | Admitting: Family Medicine

## 2017-11-06 DIAGNOSIS — M62838 Other muscle spasm: Secondary | ICD-10-CM

## 2017-11-20 DIAGNOSIS — F3289 Other specified depressive episodes: Secondary | ICD-10-CM | POA: Diagnosis not present

## 2017-11-20 DIAGNOSIS — M5416 Radiculopathy, lumbar region: Secondary | ICD-10-CM | POA: Diagnosis not present

## 2017-11-20 DIAGNOSIS — M549 Dorsalgia, unspecified: Secondary | ICD-10-CM | POA: Diagnosis not present

## 2017-11-20 DIAGNOSIS — M961 Postlaminectomy syndrome, not elsewhere classified: Secondary | ICD-10-CM | POA: Diagnosis not present

## 2017-11-20 DIAGNOSIS — M5417 Radiculopathy, lumbosacral region: Secondary | ICD-10-CM | POA: Diagnosis not present

## 2017-12-03 DIAGNOSIS — F329 Major depressive disorder, single episode, unspecified: Secondary | ICD-10-CM | POA: Diagnosis not present

## 2017-12-03 DIAGNOSIS — G894 Chronic pain syndrome: Secondary | ICD-10-CM | POA: Diagnosis not present

## 2017-12-03 DIAGNOSIS — I10 Essential (primary) hypertension: Secondary | ICD-10-CM | POA: Diagnosis not present

## 2017-12-03 DIAGNOSIS — M5417 Radiculopathy, lumbosacral region: Secondary | ICD-10-CM | POA: Diagnosis not present

## 2017-12-03 DIAGNOSIS — E785 Hyperlipidemia, unspecified: Secondary | ICD-10-CM | POA: Diagnosis not present

## 2017-12-10 DIAGNOSIS — M5442 Lumbago with sciatica, left side: Secondary | ICD-10-CM | POA: Diagnosis not present

## 2017-12-10 DIAGNOSIS — G894 Chronic pain syndrome: Secondary | ICD-10-CM | POA: Diagnosis not present

## 2017-12-10 DIAGNOSIS — M5416 Radiculopathy, lumbar region: Secondary | ICD-10-CM | POA: Diagnosis not present

## 2017-12-10 DIAGNOSIS — M5417 Radiculopathy, lumbosacral region: Secondary | ICD-10-CM | POA: Diagnosis not present

## 2017-12-10 DIAGNOSIS — F3289 Other specified depressive episodes: Secondary | ICD-10-CM | POA: Diagnosis not present

## 2017-12-10 DIAGNOSIS — M549 Dorsalgia, unspecified: Secondary | ICD-10-CM | POA: Diagnosis not present

## 2017-12-16 ENCOUNTER — Other Ambulatory Visit: Payer: Self-pay | Admitting: Family Medicine

## 2017-12-16 DIAGNOSIS — M62838 Other muscle spasm: Secondary | ICD-10-CM

## 2017-12-30 ENCOUNTER — Other Ambulatory Visit: Payer: Self-pay

## 2017-12-30 NOTE — Telephone Encounter (Signed)
Requests refill on Lyrica.

## 2017-12-31 MED ORDER — PREGABALIN 75 MG PO CAPS: 150.0000 mg | ORAL_CAPSULE | Freq: Two times a day (BID) | ORAL | 1 refills | Status: DC

## 2018-01-01 ENCOUNTER — Telehealth: Payer: Self-pay

## 2018-01-01 MED ORDER — PREGABALIN 75 MG PO CAPS: 75.0000 mg | ORAL_CAPSULE | Freq: Three times a day (TID) | ORAL | 1 refills | Status: DC

## 2018-01-01 NOTE — Telephone Encounter (Signed)
[facility] called and states the insurance will not pay for 4 daily only 3 tablets of the Lyrica. Patient states he takes it TID. Please advise.

## 2018-01-01 NOTE — Telephone Encounter (Signed)
New rx sent for TID doseing

## 2018-01-14 ENCOUNTER — Other Ambulatory Visit: Payer: Self-pay | Admitting: Family Medicine

## 2018-01-14 DIAGNOSIS — M62838 Other muscle spasm: Secondary | ICD-10-CM

## 2018-02-01 ENCOUNTER — Ambulatory Visit (INDEPENDENT_AMBULATORY_CARE_PROVIDER_SITE_OTHER): Payer: Medicare Other | Admitting: Family Medicine

## 2018-02-01 VITALS — Temp 98.6°F | Wt 222.0 lb

## 2018-02-01 DIAGNOSIS — M5416 Radiculopathy, lumbar region: Secondary | ICD-10-CM

## 2018-02-01 DIAGNOSIS — Z23 Encounter for immunization: Secondary | ICD-10-CM | POA: Diagnosis not present

## 2018-02-01 MED ORDER — KETOROLAC TROMETHAMINE 60 MG/2ML IM SOLN
60.0000 mg | Freq: Once | INTRAMUSCULAR | Status: AC
  Administered 2018-02-01: 60 mg via INTRAMUSCULAR

## 2018-02-01 NOTE — Progress Notes (Signed)
Pt in today for vaccinations. Pt received Flu,shingrix and tdap vaccines today. Pt also requested while here if he could received injection of toradol for pain in his lower back. 60 mg toradol was given per orders from provider. Pt tolerated injections well without complications.

## 2018-02-02 ENCOUNTER — Other Ambulatory Visit: Payer: Self-pay

## 2018-02-02 ENCOUNTER — Encounter: Payer: Self-pay | Admitting: Family Medicine

## 2018-02-02 NOTE — Progress Notes (Signed)
Agree with documentation as above.   Vane Yapp, MD  

## 2018-02-08 ENCOUNTER — Other Ambulatory Visit: Payer: Self-pay

## 2018-02-08 ENCOUNTER — Emergency Department (INDEPENDENT_AMBULATORY_CARE_PROVIDER_SITE_OTHER)
Admission: EM | Admit: 2018-02-08 | Discharge: 2018-02-08 | Disposition: A | Discharge status: Home / Self Care | Payer: Medicare Other | Source: Home / Self Care

## 2018-02-08 ENCOUNTER — Encounter: Payer: Self-pay | Admitting: Emergency Medicine

## 2018-02-08 DIAGNOSIS — L0291 Cutaneous abscess, unspecified: Secondary | ICD-10-CM | POA: Diagnosis not present

## 2018-02-08 MED ORDER — DOXYCYCLINE HYCLATE 100 MG PO TABS: 100.0000 mg | ORAL_TABLET | Freq: Two times a day (BID) | ORAL | 0 refills | Status: DC

## 2018-02-08 MED ORDER — KETOROLAC TROMETHAMINE 60 MG/2ML IM SOLN
60.0000 mg | Freq: Once | INTRAMUSCULAR | Status: AC
  Administered 2018-02-08: 60 mg via INTRAMUSCULAR

## 2018-02-08 NOTE — Discharge Instructions (Signed)
Angle of Repose by Nicholas Lose
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
## 2018-02-08 NOTE — ED Triage Notes (Signed)
Abscess top of buttocks, 2-3 days, draining, and perineum, 6-7 months

## 2018-02-09 DIAGNOSIS — M5417 Radiculopathy, lumbosacral region: Secondary | ICD-10-CM | POA: Diagnosis not present

## 2018-02-09 DIAGNOSIS — F3289 Other specified depressive episodes: Secondary | ICD-10-CM | POA: Diagnosis not present

## 2018-02-09 DIAGNOSIS — Z79899 Other long term (current) drug therapy: Secondary | ICD-10-CM | POA: Diagnosis not present

## 2018-02-09 DIAGNOSIS — M549 Dorsalgia, unspecified: Secondary | ICD-10-CM | POA: Diagnosis not present

## 2018-02-09 DIAGNOSIS — Z5181 Encounter for therapeutic drug level monitoring: Secondary | ICD-10-CM | POA: Diagnosis not present

## 2018-02-09 DIAGNOSIS — M5416 Radiculopathy, lumbar region: Secondary | ICD-10-CM | POA: Diagnosis not present

## 2018-02-09 DIAGNOSIS — M961 Postlaminectomy syndrome, not elsewhere classified: Secondary | ICD-10-CM | POA: Diagnosis not present

## 2018-02-18 DIAGNOSIS — M47817 Spondylosis without myelopathy or radiculopathy, lumbosacral region: Secondary | ICD-10-CM | POA: Diagnosis not present

## 2018-02-18 DIAGNOSIS — M1990 Unspecified osteoarthritis, unspecified site: Secondary | ICD-10-CM | POA: Diagnosis not present

## 2018-02-18 DIAGNOSIS — Z79899 Other long term (current) drug therapy: Secondary | ICD-10-CM | POA: Diagnosis not present

## 2018-02-18 DIAGNOSIS — F17200 Nicotine dependence, unspecified, uncomplicated: Secondary | ICD-10-CM | POA: Diagnosis not present

## 2018-02-18 DIAGNOSIS — L603 Nail dystrophy: Secondary | ICD-10-CM | POA: Diagnosis not present

## 2018-02-18 DIAGNOSIS — G894 Chronic pain syndrome: Secondary | ICD-10-CM | POA: Diagnosis not present

## 2018-02-18 DIAGNOSIS — K219 Gastro-esophageal reflux disease without esophagitis: Secondary | ICD-10-CM | POA: Diagnosis not present

## 2018-02-18 DIAGNOSIS — M5136 Other intervertebral disc degeneration, lumbar region: Secondary | ICD-10-CM | POA: Diagnosis not present

## 2018-02-18 DIAGNOSIS — I10 Essential (primary) hypertension: Secondary | ICD-10-CM | POA: Diagnosis not present

## 2018-02-18 DIAGNOSIS — J310 Chronic rhinitis: Secondary | ICD-10-CM | POA: Diagnosis not present

## 2018-02-18 DIAGNOSIS — F329 Major depressive disorder, single episode, unspecified: Secondary | ICD-10-CM | POA: Diagnosis not present

## 2018-02-18 DIAGNOSIS — E785 Hyperlipidemia, unspecified: Secondary | ICD-10-CM | POA: Diagnosis not present

## 2018-02-18 DIAGNOSIS — M48061 Spinal stenosis, lumbar region without neurogenic claudication: Secondary | ICD-10-CM | POA: Diagnosis not present

## 2018-02-18 DIAGNOSIS — G609 Hereditary and idiopathic neuropathy, unspecified: Secondary | ICD-10-CM | POA: Diagnosis not present

## 2018-02-18 DIAGNOSIS — M5417 Radiculopathy, lumbosacral region: Secondary | ICD-10-CM | POA: Diagnosis not present

## 2018-03-22 ENCOUNTER — Other Ambulatory Visit: Payer: Self-pay | Admitting: Family Medicine

## 2018-03-22 DIAGNOSIS — M62838 Other muscle spasm: Secondary | ICD-10-CM

## 2018-04-14 DIAGNOSIS — M5442 Lumbago with sciatica, left side: Secondary | ICD-10-CM | POA: Diagnosis not present

## 2018-04-14 DIAGNOSIS — G8929 Other chronic pain: Secondary | ICD-10-CM | POA: Diagnosis not present

## 2018-04-14 DIAGNOSIS — M5417 Radiculopathy, lumbosacral region: Secondary | ICD-10-CM | POA: Diagnosis not present

## 2018-04-14 DIAGNOSIS — M5416 Radiculopathy, lumbar region: Secondary | ICD-10-CM | POA: Diagnosis not present

## 2018-04-29 DIAGNOSIS — M48061 Spinal stenosis, lumbar region without neurogenic claudication: Secondary | ICD-10-CM | POA: Diagnosis not present

## 2018-04-29 DIAGNOSIS — I10 Essential (primary) hypertension: Secondary | ICD-10-CM | POA: Diagnosis not present

## 2018-04-29 DIAGNOSIS — M5417 Radiculopathy, lumbosacral region: Secondary | ICD-10-CM | POA: Diagnosis not present

## 2018-04-29 DIAGNOSIS — K219 Gastro-esophageal reflux disease without esophagitis: Secondary | ICD-10-CM | POA: Diagnosis not present

## 2018-04-29 DIAGNOSIS — F17200 Nicotine dependence, unspecified, uncomplicated: Secondary | ICD-10-CM | POA: Diagnosis not present

## 2018-05-03 ENCOUNTER — Ambulatory Visit: Payer: Medicare Other | Admitting: Family Medicine

## 2018-05-03 ENCOUNTER — Ambulatory Visit (INDEPENDENT_AMBULATORY_CARE_PROVIDER_SITE_OTHER): Payer: Medicare Other | Admitting: Family Medicine

## 2018-05-03 VITALS — BP 122/69 | HR 73 | Ht 73.0 in | Wt 227.0 lb

## 2018-05-03 DIAGNOSIS — M5416 Radiculopathy, lumbar region: Secondary | ICD-10-CM

## 2018-05-03 DIAGNOSIS — Z23 Encounter for immunization: Secondary | ICD-10-CM

## 2018-05-03 DIAGNOSIS — M79605 Pain in left leg: Secondary | ICD-10-CM

## 2018-05-03 DIAGNOSIS — R0982 Postnasal drip: Secondary | ICD-10-CM | POA: Diagnosis not present

## 2018-05-03 DIAGNOSIS — M25552 Pain in left hip: Secondary | ICD-10-CM

## 2018-05-03 DIAGNOSIS — Z79891 Long term (current) use of opiate analgesic: Secondary | ICD-10-CM | POA: Insufficient documentation

## 2018-05-03 DIAGNOSIS — M797 Fibromyalgia: Secondary | ICD-10-CM | POA: Insufficient documentation

## 2018-05-03 DIAGNOSIS — F39 Unspecified mood [affective] disorder: Secondary | ICD-10-CM | POA: Diagnosis not present

## 2018-05-03 DIAGNOSIS — E860 Dehydration: Secondary | ICD-10-CM | POA: Insufficient documentation

## 2018-05-03 DIAGNOSIS — L0292 Furuncle, unspecified: Secondary | ICD-10-CM

## 2018-05-03 DIAGNOSIS — H9313 Tinnitus, bilateral: Secondary | ICD-10-CM | POA: Diagnosis not present

## 2018-05-03 DIAGNOSIS — M5136 Other intervertebral disc degeneration, lumbar region: Secondary | ICD-10-CM | POA: Insufficient documentation

## 2018-05-03 DIAGNOSIS — M47816 Spondylosis without myelopathy or radiculopathy, lumbar region: Secondary | ICD-10-CM | POA: Insufficient documentation

## 2018-05-03 MED ORDER — KETOROLAC TROMETHAMINE 60 MG/2ML IM SOLN
60.0000 mg | Freq: Once | INTRAMUSCULAR | Status: AC
  Administered 2018-05-03: 60 mg via INTRAMUSCULAR

## 2018-05-03 NOTE — Patient Instructions (Signed)
Can try Zyrtec for the sinuses and postnasal drip.

## 2018-05-03 NOTE — Progress Notes (Signed)
Subjective:    CC: F/U Back pain.   HPI:  He would like a toradol injection for his back.  He says actually a couple of months ago he was trying to stand and his left leg gave out and he fell on his bottom.  Ever since then he has had a significant increase in his low back left hip and left leg pain.  He says it feels like it radiates all the way down his leg it is not worse on one side or the other.  I asked if he seen any bruising on the skin of the fall and he says he did not.  But he also just cannot see his backside.  He describes the pain in his hip and leg as constant.  It goes all the way down to about the midfoot.  He says he did not notice any swelling.  But he says his toes have been very sensitive since the fall.  He says he has to use a cane and a crutch to support his weight when he does try to ambulate.  He is requesting a Toradol injection today. He follows with the pain clinic nad is on oxycodone and Nucynta with Dr. Mechele Dawley  He has had some post nasal drip that he feels is causing his ear to "hiss".  He has been using Benadryl and sometimes Allegra.  He uses Flonase and Atrovent nasal spray regularly.  The hissing is occurring in both ears and it is about the same.  He says maybe it is a little better first thing in the morning but then persist throughout the day.  He also complains of recurrent boils near his rectum.  He says he has had one that is been there for almost 6 months and one that he actually was seen in the urgent care for in November he says he was given some antibiotics but it never completely goes away.  He also wanted to know about where he can find a "light" to help with his mood.    Past medical history, Surgical history, Family history not pertinant except as noted below, Social history, Allergies, and medications have been entered into the medical record, reviewed, and corrections made.   Review of Systems: No fevers, chills, night sweats, weight loss, chest pain,  or shortness of breath.   Objective:    General: Well Developed, well nourished, and in no acute distress.  Neuro: Alert and oriented x3, extra-ocular muscles intact, sensation grossly intact.  HEENT: Normocephalic, atraumatic, TMs and canals are clear bilaterally. No sig cervical LNs.  Skin: Warm and dry, no rashes. Cardiac: Regular rate and rhythm, no murmurs rubs or gallops, no lower extremity edema.  Respiratory: Clear to auscultation bilaterally. Not using accessory muscles, speaking in full sentences. MSK: He is able to flex his left hip.  He did have pain at the hip with internal and external rotation.  He is able to bend the knee as well as his left ankle.  Since he is having significant difficulty putting weight on the leg I did not have him stand today he was in a wheelchair.   Impression and Recommendations:   Low back/left hip and left leg pain that has persisted after a fall almost 2 months ago.  He has pain over his left hip with internal and external rotation of the hip.  I am concerned about the potential for a fracture and would like for him to get x-rays.  He says he feels  too tired to go today but will try to come later this week.  Ear ringing-ear exam overall benign.  He has some distorted light reflexes but he is already on a nasal steroid spray so plan to refer to ENT for further work-up.  Also recommend a trial of Zyrtec.   Post nasal drip. Already on nasal steroid, nasal anticholinergic and oral zyrtec.  Uses nasal saline rinse BID.    Boils -suspect based on his description and the urgent care note that he probably has pilonidal cyst refer to gen surgery for consultation.    Mood D/O - explained that patients with SAD often benefit from "light therapy". There are several different lights that can be found online.  Also recommend that he make sure he is taking Vitamin D through the winter months.   Time spent 45 min, > 50% spent face to face counseling about ear  ringing, post nasal drip, boils, left leg pain, left hip pain, low back pain, and mood d/o.

## 2018-05-04 ENCOUNTER — Encounter: Payer: Self-pay | Admitting: Family Medicine

## 2018-05-11 DIAGNOSIS — K603 Anal fistula: Secondary | ICD-10-CM | POA: Diagnosis not present

## 2018-05-18 ENCOUNTER — Telehealth: Payer: Self-pay | Admitting: Physician Assistant

## 2018-05-18 ENCOUNTER — Other Ambulatory Visit: Payer: Self-pay | Admitting: Family Medicine

## 2018-05-18 DIAGNOSIS — M62838 Other muscle spasm: Secondary | ICD-10-CM

## 2018-05-18 NOTE — Telephone Encounter (Signed)
Cindy, Can you follow-up on this patient's referral to general surgery? Dr. Jerilynn Mages placed it 3 weeks ago and patient is on my schedule today for this same issue. Thanks!

## 2018-05-19 ENCOUNTER — Ambulatory Visit (INDEPENDENT_AMBULATORY_CARE_PROVIDER_SITE_OTHER): Payer: Medicare Other | Admitting: Physician Assistant

## 2018-05-19 ENCOUNTER — Encounter: Payer: Self-pay | Admitting: Physician Assistant

## 2018-05-19 VITALS — BP 119/78 | HR 72 | Temp 98.1°F | Wt 225.0 lb

## 2018-05-19 DIAGNOSIS — L988 Other specified disorders of the skin and subcutaneous tissue: Secondary | ICD-10-CM | POA: Diagnosis not present

## 2018-05-19 MED ORDER — KETOROLAC TROMETHAMINE 30 MG/ML IJ SOLN
30.0000 mg | Freq: Once | INTRAMUSCULAR | Status: AC
  Administered 2018-05-19: 30 mg via INTRAMUSCULAR

## 2018-05-19 MED ORDER — DOXYCYCLINE HYCLATE 100 MG PO TABS: 100.0000 mg | ORAL_TABLET | Freq: Two times a day (BID) | ORAL | 0 refills | Status: AC

## 2018-05-19 NOTE — Patient Instructions (Signed)
Pilonidal Cyst    A pilonidal cyst is a fluid-filled sac that forms beneath the skin near the tailbone, at the top of the crease of the buttocks (pilonidal area). If the cyst is not large and not infected, it may not cause any problems.  If the cyst becomes irritated or infected, it may get larger and fill with pus. An infected cyst is called an abscess. A pilonidal abscess may cause pain and swelling, and it may need to be drained or removed.  What are the causes?  The cause of this condition is not always known. In some cases, a hair that grows into your skin (ingrown hair) may be the cause.  What increases the risk?  You are more likely to get a pilonidal cyst if you:   Are male.   Have lots of hair near the crease of the buttocks.   Are overweight.   Have a dimple near the crease of the buttocks.   Wear tight clothing.   Do not bathe or shower often.   Sit for long periods of time.  What are the signs or symptoms?  Signs and symptoms of a pilonidal cyst may include pain, swelling, redness, and warmth in the pilonidal area. Depending on how big the cyst is, you may be able to feel a lump near your tailbone. If your cyst becomes infected, symptoms may include:   Pus or fluid drainage.   Fever.   Pain, swelling, and redness getting worse.   The lump getting bigger.  How is this diagnosed?  This condition may be diagnosed based on:   Your symptoms and medical history.   A physical exam.   A blood test to check for infection.   Testing a pus sample, if applicable.  How is this treated?  If your cyst does not cause symptoms, you may not need any treatment. If your cyst bothers you or is infected, you may need a procedure to drain or remove the cyst. Depending on the size, location, and severity of your cyst, your health care provider may:   Make an incision in the cyst and drain it (incision and drainage).   Open and drain the cyst, and then stitch the wound so that it stays open while it heals  (marsupialization). You will be given instructions about how to care for your open wound while it heals.   Remove all or part of the cyst, and then close the wound (cyst removal).  You may need to take antibiotic medicines before your procedure.  Follow these instructions at home:  Medicines   Take over-the-counter and prescription medicines only as told by your health care provider.   If you were prescribed an antibiotic medicine, take it as told by your health care provider. Do not stop taking the antibiotic even if you start to feel better.  General instructions   Keep the area around your pilonidal cyst clean and dry.   If there is fluid or pus draining from your cyst:  ? Cover the area with a clean bandage (dressing) as needed.  ? Wash the area gently with soap and water. Pat the area dry with a clean towel. Do not rub the area because that may cause bleeding.   Remove hair from the area around the cyst only if your health care provider tells you to do this.   Do not wear tight pants or sit in one position for long periods at a time.   Keep all follow-up   visits as told by your health care provider. This is important.  Contact a health care provider if you have:   New redness, swelling, or pain.   A fever.   Severe pain.  Summary   A pilonidal cyst is a fluid-filled sac that forms beneath the skin near the tailbone, at the top of the crease of the buttocks (pilonidal area).   If the cyst becomes irritated or infected, it may get larger and fill with pus. An infected cyst is called an abscess.   The cause of this condition is not always known. In some cases, a hair that grows into your skin (ingrown hair) may be the cause.   If your cyst does not cause symptoms, you may not need any treatment. If your cyst bothers you or is infected, you may need a procedure to drain or remove the cyst.  This information is not intended to replace advice given to you by your health care provider. Make sure you  discuss any questions you have with your health care provider.  Document Released: 02/29/2000 Document Revised: 02/19/2017 Document Reviewed: 02/19/2017  [employer]vier Interactive Patient Education  2019 [employer]
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
## 2018-05-19 NOTE — Telephone Encounter (Signed)
I called [facility] and patient was seen on 05/11/2018 and is scheduled on 05/31/2018 for surgery. - CF

## 2018-05-25 ENCOUNTER — Encounter: Payer: Self-pay | Admitting: Physician Assistant

## 2018-05-26 DIAGNOSIS — H838X3 Other specified diseases of inner ear, bilateral: Secondary | ICD-10-CM | POA: Diagnosis not present

## 2018-05-26 DIAGNOSIS — H903 Sensorineural hearing loss, bilateral: Secondary | ICD-10-CM | POA: Diagnosis not present

## 2018-05-26 DIAGNOSIS — J310 Chronic rhinitis: Secondary | ICD-10-CM | POA: Diagnosis not present

## 2018-05-26 DIAGNOSIS — H6983 Other specified disorders of Eustachian tube, bilateral: Secondary | ICD-10-CM | POA: Diagnosis not present

## 2018-05-26 DIAGNOSIS — H9313 Tinnitus, bilateral: Secondary | ICD-10-CM | POA: Diagnosis not present

## 2018-06-01 ENCOUNTER — Telehealth: Payer: Self-pay

## 2018-06-01 NOTE — Telephone Encounter (Signed)
Called pt and advised him that this should be something his original provider should be giving. Pt wanted to know if Dr Madilyn Fireman could just write him some Lyrica instead.[name] advised him no, she will not be taking over writing these medications for him. He is to contact the [place] and work it out with them as far as getting his short term supply.  Pt thankful for the help and is agreeable to contact [facility].

## 2018-06-01 NOTE — Telephone Encounter (Signed)
They should be willing to write him a 10-day supply without him having to come in if his mail order has not come in.

## 2018-06-01 NOTE — Telephone Encounter (Signed)
Pt called stating that he is going to run out of his Gabapentin tonight and is waiting for up to 10 days for mail order to deliver the next refill. Pt usually gets this med from [place] but states that he is unable to go to [place] to get a short term supply due to them "restricting patients going on site unless they are deathly ill".  Pt takes Gabapentin 600 mg, 2 tab TID. Requesting a 10 day supply be sent to [facility] to hold him over until he can get mail order in from [place]. Last RX for this was written by Herold Harms at [facility] office.   Please advise

## 2018-06-09 DIAGNOSIS — M5416 Radiculopathy, lumbar region: Secondary | ICD-10-CM | POA: Diagnosis not present

## 2018-06-22 ENCOUNTER — Telehealth: Payer: Self-pay

## 2018-06-22 NOTE — Telephone Encounter (Signed)
Palled stating Dr Madilyn Fireman had followed him on the boils he has located on buttock. Pt reports he was supposed to have surgery on them, but it was cancelled due to [place]. Today, one boil has "filled up and busted". Pt reports only clear fluid and blood is draining, no pus. Has some complaints of pain and discomfort.   Unsure what his next steps should be

## 2018-06-23 MED ORDER — DOXYCYCLINE HYCLATE 100 MG PO TABS: 100.0000 mg | ORAL_TABLET | Freq: Two times a day (BID) | ORAL | 0 refills | Status: DC

## 2018-06-23 NOTE — Telephone Encounter (Signed)
Pt advised.

## 2018-06-23 NOTE — Telephone Encounter (Signed)
[facility]. Warm compresses ti improve dainage.

## 2018-07-19 DIAGNOSIS — M5416 Radiculopathy, lumbar region: Secondary | ICD-10-CM | POA: Diagnosis not present

## 2018-07-19 DIAGNOSIS — M5442 Lumbago with sciatica, left side: Secondary | ICD-10-CM | POA: Diagnosis not present

## 2018-07-19 DIAGNOSIS — M549 Dorsalgia, unspecified: Secondary | ICD-10-CM | POA: Diagnosis not present

## 2018-07-19 DIAGNOSIS — M5417 Radiculopathy, lumbosacral region: Secondary | ICD-10-CM | POA: Diagnosis not present

## 2018-08-11 ENCOUNTER — Other Ambulatory Visit: Payer: Self-pay | Admitting: Family Medicine

## 2018-08-11 DIAGNOSIS — M62838 Other muscle spasm: Secondary | ICD-10-CM

## 2018-08-12 DIAGNOSIS — M48061 Spinal stenosis, lumbar region without neurogenic claudication: Secondary | ICD-10-CM | POA: Diagnosis not present

## 2018-08-12 DIAGNOSIS — M1990 Unspecified osteoarthritis, unspecified site: Secondary | ICD-10-CM | POA: Diagnosis not present

## 2018-08-12 DIAGNOSIS — I10 Essential (primary) hypertension: Secondary | ICD-10-CM | POA: Diagnosis not present

## 2018-08-12 DIAGNOSIS — M5416 Radiculopathy, lumbar region: Secondary | ICD-10-CM | POA: Diagnosis not present

## 2018-08-12 DIAGNOSIS — K219 Gastro-esophageal reflux disease without esophagitis: Secondary | ICD-10-CM | POA: Diagnosis not present

## 2018-08-12 DIAGNOSIS — J310 Chronic rhinitis: Secondary | ICD-10-CM | POA: Diagnosis not present

## 2018-08-12 DIAGNOSIS — M5417 Radiculopathy, lumbosacral region: Secondary | ICD-10-CM | POA: Diagnosis not present

## 2018-08-12 DIAGNOSIS — F329 Major depressive disorder, single episode, unspecified: Secondary | ICD-10-CM | POA: Diagnosis not present

## 2018-08-12 DIAGNOSIS — G609 Hereditary and idiopathic neuropathy, unspecified: Secondary | ICD-10-CM | POA: Diagnosis not present

## 2018-08-12 DIAGNOSIS — F17200 Nicotine dependence, unspecified, uncomplicated: Secondary | ICD-10-CM | POA: Diagnosis not present

## 2018-08-18 DIAGNOSIS — M5416 Radiculopathy, lumbar region: Secondary | ICD-10-CM | POA: Diagnosis not present

## 2018-09-15 DIAGNOSIS — M5417 Radiculopathy, lumbosacral region: Secondary | ICD-10-CM | POA: Diagnosis not present

## 2018-09-15 DIAGNOSIS — M5416 Radiculopathy, lumbar region: Secondary | ICD-10-CM | POA: Diagnosis not present

## 2018-09-15 DIAGNOSIS — Z720 Tobacco use: Secondary | ICD-10-CM | POA: Diagnosis not present

## 2018-09-15 DIAGNOSIS — M79604 Pain in right leg: Secondary | ICD-10-CM | POA: Diagnosis not present

## 2018-10-14 ENCOUNTER — Telehealth: Payer: Self-pay

## 2018-10-14 DIAGNOSIS — K219 Gastro-esophageal reflux disease without esophagitis: Secondary | ICD-10-CM | POA: Diagnosis not present

## 2018-10-14 DIAGNOSIS — F329 Major depressive disorder, single episode, unspecified: Secondary | ICD-10-CM | POA: Diagnosis not present

## 2018-10-14 DIAGNOSIS — R52 Pain, unspecified: Secondary | ICD-10-CM | POA: Diagnosis not present

## 2018-10-14 DIAGNOSIS — Z8739 Personal history of other diseases of the musculoskeletal system and connective tissue: Secondary | ICD-10-CM | POA: Diagnosis not present

## 2018-10-14 DIAGNOSIS — I10 Essential (primary) hypertension: Secondary | ICD-10-CM | POA: Diagnosis not present

## 2018-10-14 DIAGNOSIS — Z79899 Other long term (current) drug therapy: Secondary | ICD-10-CM | POA: Diagnosis not present

## 2018-10-14 DIAGNOSIS — Z043 Encounter for examination and observation following other accident: Secondary | ICD-10-CM | POA: Diagnosis not present

## 2018-10-14 DIAGNOSIS — M62830 Muscle spasm of back: Secondary | ICD-10-CM | POA: Diagnosis not present

## 2018-10-14 DIAGNOSIS — Z87891 Personal history of nicotine dependence: Secondary | ICD-10-CM | POA: Diagnosis not present

## 2018-10-14 DIAGNOSIS — M545 Low back pain: Secondary | ICD-10-CM | POA: Diagnosis not present

## 2018-10-14 DIAGNOSIS — G8929 Other chronic pain: Secondary | ICD-10-CM | POA: Diagnosis not present

## 2018-10-14 DIAGNOSIS — Z7951 Long term (current) use of inhaled steroids: Secondary | ICD-10-CM | POA: Diagnosis not present

## 2018-10-14 DIAGNOSIS — M1990 Unspecified osteoarthritis, unspecified site: Secondary | ICD-10-CM | POA: Diagnosis not present

## 2018-10-14 NOTE — Telephone Encounter (Signed)
Patient called with concerns that he may have a new spinal cord injury. Patient states that for the last four weeks, he has been unable to move with a new crippling back pain. Patient states he is barely able to go to rest room or feed his dog. States he has not been eating the last 4 weeks due to being unable to move.   I advised patient that he needs to be evaluated ASAP. Patient has concerns about being seen at ER because he is under pain management, and does not want to look like he is seeking more medication. Patient states he does not have anyone who can take him to hospital. I advised patient that if he is not able to move and no one can help, he needs to call 911 to get to ER. Patient agreeable and states he is going to make sure his dog is taken care of then will go to ER.   FYI to PCP

## 2018-10-15 DIAGNOSIS — Z5181 Encounter for therapeutic drug level monitoring: Secondary | ICD-10-CM | POA: Diagnosis not present

## 2018-10-15 DIAGNOSIS — Z79899 Other long term (current) drug therapy: Secondary | ICD-10-CM | POA: Diagnosis not present

## 2018-10-18 ENCOUNTER — Other Ambulatory Visit: Payer: Self-pay | Admitting: Family Medicine

## 2018-10-18 DIAGNOSIS — M62838 Other muscle spasm: Secondary | ICD-10-CM

## 2018-10-18 DIAGNOSIS — M5442 Lumbago with sciatica, left side: Secondary | ICD-10-CM | POA: Diagnosis not present

## 2018-10-18 DIAGNOSIS — M5136 Other intervertebral disc degeneration, lumbar region: Secondary | ICD-10-CM | POA: Diagnosis not present

## 2018-10-18 DIAGNOSIS — Z720 Tobacco use: Secondary | ICD-10-CM | POA: Diagnosis not present

## 2018-10-18 DIAGNOSIS — M5416 Radiculopathy, lumbar region: Secondary | ICD-10-CM | POA: Diagnosis not present

## 2018-10-29 ENCOUNTER — Encounter: Payer: Self-pay | Admitting: Family Medicine

## 2018-10-29 ENCOUNTER — Ambulatory Visit (INDEPENDENT_AMBULATORY_CARE_PROVIDER_SITE_OTHER): Payer: Medicare Other | Admitting: Family Medicine

## 2018-10-29 ENCOUNTER — Other Ambulatory Visit: Payer: Self-pay

## 2018-10-29 VITALS — BP 126/89 | HR 111 | Ht 73.0 in | Wt 211.0 lb

## 2018-10-29 DIAGNOSIS — M47817 Spondylosis without myelopathy or radiculopathy, lumbosacral region: Secondary | ICD-10-CM | POA: Diagnosis not present

## 2018-10-29 DIAGNOSIS — I10 Essential (primary) hypertension: Secondary | ICD-10-CM | POA: Diagnosis not present

## 2018-10-29 DIAGNOSIS — M5416 Radiculopathy, lumbar region: Secondary | ICD-10-CM

## 2018-10-29 DIAGNOSIS — Z23 Encounter for immunization: Secondary | ICD-10-CM

## 2018-10-29 MED ORDER — KETOROLAC TROMETHAMINE 60 MG/2ML IM SOLN
60.0000 mg | Freq: Once | INTRAMUSCULAR | Status: AC
  Administered 2018-10-29: 60 mg via INTRAMUSCULAR
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
## 2018-11-02 ENCOUNTER — Telehealth: Payer: Self-pay

## 2018-11-02 DIAGNOSIS — M5416 Radiculopathy, lumbar region: Secondary | ICD-10-CM

## 2018-11-02 DIAGNOSIS — Z23 Encounter for immunization: Secondary | ICD-10-CM | POA: Diagnosis not present

## 2018-11-02 DIAGNOSIS — M47817 Spondylosis without myelopathy or radiculopathy, lumbosacral region: Secondary | ICD-10-CM

## 2018-11-02 NOTE — Telephone Encounter (Signed)
Patient received call from imaging to get scheduled for MRI and advised them that he needs to be "sedated" with epidural due to being in wheel chair and having "involuntary twitching" and inability to be still. Patient is currently scheduled here at [place] on 11/09/18  Were you aware of this? It looks like patient has epidural set up with pain medicine on 11/17/18.[name] unsure about what next steps are?

## 2018-11-02 NOTE — Telephone Encounter (Signed)
I can give him something to relax him but we do not do full sedation here for imaging.  Is he just needs something to relax him before the procedure.  I am just not sure.

## 2018-11-03 ENCOUNTER — Encounter: Payer: Self-pay | Admitting: Family Medicine

## 2018-11-03 ENCOUNTER — Telehealth: Payer: Self-pay

## 2018-11-03 NOTE — Assessment & Plan Note (Signed)
Well controlled. Continue current regimen. Follow up in  6 mo  

## 2018-11-03 NOTE — Telephone Encounter (Signed)
Patient called wanting to know if you have talked with John Scott regarding his condition. He states that he thought you had called them to discuss recent issues.   Trying to get patient set up ASAP for MRI. Having issues still and in a lot of pain.. not sure about next steps. Please advise

## 2018-11-03 NOTE — Telephone Encounter (Signed)
MRI order updated.  I think once we have those results will have more info to relay the [place].

## 2018-11-03 NOTE — Telephone Encounter (Signed)
Upon further investigation of chart, it appears that patient was sedated for last MRI in system that was ordered by Dr Ileene Rubens.   I have talked with imaging and we will send this patient to [facility] for scan since he needs to be sedated. No auth was required by insurance for MRI.   Cindy: can you please call centralized scheduling and get patient set up for this?  Dr Madilyn Fireman: Please let me know if there is anything further you need from me

## 2018-11-03 NOTE — Telephone Encounter (Signed)
Thank you John Scott for checking further into this issue.  New order placed with general anesthesia requested.

## 2018-11-04 NOTE — Telephone Encounter (Signed)
Dr Madilyn Fireman,  I call scheduling [phone]) to get patient set up for MRI. The sedation MRIs are only done on Tuesday and Thursday, the next opening is Thursday September 15th (arrival at 6:00 AM and must have driver). Patient had to get COVID test done prior to MRI (11:00 on 11/25/18 at [facility]).   I went ahead and had Vivien Rota at scheduling put patient down for this slot, but was unsure about this situation. I know patient wants this sooner, but I am not exactly sure what my other options are since patient requires sedation. If you have any suggestions for alternative or where to go from here, please let me know and I would be more than happy to exhaust all efforts.

## 2018-11-05 NOTE — Telephone Encounter (Signed)
I was able to contact [facility] and get patient scheduled for MRI with anesthesia on Thursday August 27th. Per Marlowe Kays with [facility] imaging:  Sunday 11/07/18 -COVID test must be done at [facility] ([place] Patient must check in at 12:45. Enter through main entrance and follow signs up to second floor. Patient must quarantine from time of testing until scan. COVID test cost $75, but they will bill insurance. Bring photo ID and ins card.  Thursday 11/11/18 -[facility] Check in time is at noon. Park in parking deck and call radiology at [phone] to let them know you are there. Nothing to eat/drink from midnight the night before. Must have a driver. Bring insurance card and photo ID.    Called and advised patient of above information, had him write it all down and repeat back to me. I called [facility] and cancelled the appt for September 15th.   I have faxed the order to [facility] for MRI and they are reviewing. They will let me know if any changes need to be made.

## 2018-11-06 ENCOUNTER — Other Ambulatory Visit: Payer: Self-pay | Admitting: Family Medicine

## 2018-11-06 DIAGNOSIS — M62838 Other muscle spasm: Secondary | ICD-10-CM

## 2018-11-07 DIAGNOSIS — Z01812 Encounter for preprocedural laboratory examination: Secondary | ICD-10-CM | POA: Diagnosis not present

## 2018-11-07 DIAGNOSIS — M5416 Radiculopathy, lumbar region: Secondary | ICD-10-CM | POA: Diagnosis not present

## 2018-11-07 DIAGNOSIS — Z20828 Contact with and (suspected) exposure to other viral communicable diseases: Secondary | ICD-10-CM | POA: Diagnosis not present

## 2018-11-09 ENCOUNTER — Other Ambulatory Visit: Payer: Medicare Other

## 2018-11-09 NOTE — Telephone Encounter (Signed)
OV notes faxed to [facility] via [facility].John Scott, [place] Crocker, CMA

## 2018-11-09 NOTE — Telephone Encounter (Signed)
Please fax over copy of my OV note to [facility].

## 2018-11-11 DIAGNOSIS — M5416 Radiculopathy, lumbar region: Secondary | ICD-10-CM | POA: Diagnosis not present

## 2018-11-15 ENCOUNTER — Telehealth: Payer: Self-pay

## 2018-11-15 NOTE — Telephone Encounter (Signed)
Patient called, wanting to know if you received results from imaging at [facility]. States he is still in a lot of pain and is not sure what the next steps are

## 2018-11-16 ENCOUNTER — Encounter: Payer: Self-pay | Admitting: Family Medicine

## 2018-11-16 ENCOUNTER — Ambulatory Visit (INDEPENDENT_AMBULATORY_CARE_PROVIDER_SITE_OTHER): Payer: Medicare Other | Admitting: Family Medicine

## 2018-11-16 DIAGNOSIS — R208 Other disturbances of skin sensation: Secondary | ICD-10-CM | POA: Diagnosis not present

## 2018-11-16 MED ORDER — NYSTATIN 100000 UNIT/ML MT SUSP: 10.0000 mL | Freq: Four times a day (QID) | OROMUCOSAL | 0 refills | Status: DC

## 2018-11-16 NOTE — Telephone Encounter (Signed)
Patient advised of results. He states he has lost another tooth front upper. He states he is also having burning in his mouth and throat for two weeks. He states he is in so much pain he would rather not live. He states he has no suicidal or homicidal thoughts.

## 2018-11-16 NOTE — Telephone Encounter (Signed)
Is there anything you need for me to do?
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
## 2018-11-16 NOTE — Progress Notes (Signed)
He reports that this started 2 wks ago.it started 1-2 days after his last OV with Dr. Madilyn Fireman.  Pt said that he has been using water to rinse his mouth. Burning lasts all day and is constant. Pt reports that his pain is 6/10. Nothing is making this worse or better. Has not noticed any mouth sores. Doesn't hurt to swallow. Not affecting his taste buds. Or smell. Denies any f/s/c/n/v/d no headaches or body aches just the back pain.  Maryruth Eve, Lahoma Crocker, CMA

## 2018-11-16 NOTE — Telephone Encounter (Signed)
I let patient know that we would contact [facility] and let them know about the new MRI results.  Also like to see if for his next office visit he could actually see the MD assigned to him.  I believe his next appointment is September 28.

## 2018-11-16 NOTE — Telephone Encounter (Signed)
We did receive his results.  He does have a lot of degenerative disc and arthritis but they really did not see any major changes from his previously imaging scan so no new findings that would explain his symptoms.  We will fax a copy over to his pain management and make them aware and see if they could see him sooner.  Apolonio Schneiders if you could help assist with this that would be awesome thank you

## 2018-11-17 NOTE — Telephone Encounter (Signed)
Yes, can you please call [facility] and see if he can actually see the MD assigned to him for his next office visit.

## 2018-11-18 NOTE — Telephone Encounter (Signed)
Okay, please just let patient know so that he can address some of his issues with the MD at his appointment on the 15th for the procedure.  Hopefully they will have some designated time for them to actually talk

## 2018-11-18 NOTE — Telephone Encounter (Signed)
[place]. I was advised that patient will be seeing MD assigned to him at his upcoming appt on 9/15 for his procedure, then will see the PA at his follow up on 9/28

## 2018-11-18 NOTE — Telephone Encounter (Signed)
Pt advised.

## 2018-11-19 ENCOUNTER — Telehealth: Payer: Self-pay | Admitting: Family Medicine

## 2018-11-19 NOTE — Telephone Encounter (Signed)
Spoke with Pt, he reports he took his third dose of the nystatin rx and has had vomiting/diarrhea since then. States he is throwing up every 4 hours. Questions if this is from the medicine or could be COVID.   Does report he would hold the nystain in his mouth for 1 min then swallow it. States his mouth is not better. Routing.

## 2018-11-19 NOTE — Telephone Encounter (Signed)
I called the patient and he has finished the medication and he is no longer having any symptoms. He is agreeable to go to be evaluated if his symptoms persist. He does not wish to be tested for COVID at this time.

## 2018-11-19 NOTE — Telephone Encounter (Signed)
Would be unusual reaction to nystatin. Stop the med if if not feeling some better by the afternoon recommend he go to the ED to be evaluated and be tested for COVID

## 2018-11-23 ENCOUNTER — Telehealth: Payer: Self-pay

## 2018-11-23 DIAGNOSIS — F17200 Nicotine dependence, unspecified, uncomplicated: Secondary | ICD-10-CM | POA: Diagnosis not present

## 2018-11-23 DIAGNOSIS — R109 Unspecified abdominal pain: Secondary | ICD-10-CM | POA: Diagnosis not present

## 2018-11-23 DIAGNOSIS — K529 Noninfective gastroenteritis and colitis, unspecified: Secondary | ICD-10-CM | POA: Diagnosis not present

## 2018-11-23 DIAGNOSIS — K8020 Calculus of gallbladder without cholecystitis without obstruction: Secondary | ICD-10-CM | POA: Diagnosis not present

## 2018-11-23 DIAGNOSIS — D1803 Hemangioma of intra-abdominal structures: Secondary | ICD-10-CM | POA: Diagnosis not present

## 2018-11-23 DIAGNOSIS — Z79899 Other long term (current) drug therapy: Secondary | ICD-10-CM | POA: Diagnosis not present

## 2018-11-23 DIAGNOSIS — J439 Emphysema, unspecified: Secondary | ICD-10-CM | POA: Diagnosis not present

## 2018-11-23 DIAGNOSIS — F329 Major depressive disorder, single episode, unspecified: Secondary | ICD-10-CM | POA: Diagnosis not present

## 2018-11-23 DIAGNOSIS — Z8739 Personal history of other diseases of the musculoskeletal system and connective tissue: Secondary | ICD-10-CM | POA: Diagnosis not present

## 2018-11-23 DIAGNOSIS — G8929 Other chronic pain: Secondary | ICD-10-CM | POA: Diagnosis not present

## 2018-11-23 DIAGNOSIS — I10 Essential (primary) hypertension: Secondary | ICD-10-CM | POA: Diagnosis not present

## 2018-11-23 DIAGNOSIS — N281 Cyst of kidney, acquired: Secondary | ICD-10-CM | POA: Diagnosis not present

## 2018-11-23 NOTE — Telephone Encounter (Signed)
[name] states he has diarrhea and chills for 5 days. He has taken anti-diarrhea medication with out relief. He also states he is in a lot of pain and his pain medication is no longer helping. I advise patient to go to the ED.

## 2018-11-24 NOTE — Telephone Encounter (Signed)
I gues for now he will have ot see who is there. I prefer it be an MD/DO not a APP

## 2018-11-24 NOTE — Telephone Encounter (Signed)
Received call from patient stating that he was told he will not see his provider, Dr Veverly Fells, at his appt on 11/30/18 as he was promised.   I called [facility] and was advised that provider is on extended leave and they are unsure about his return date. Patient is scheduled to see a covering provider, Dr Maryruth Eve, that day. Per CPI, there are several different providers coming in to see Dr Veverly Fells' patient, cant be guaranteed patient will see same provider multiple times..   Where should we proceed from here?

## 2018-11-25 ENCOUNTER — Ambulatory Visit (HOSPITAL_COMMUNITY): Payer: Medicare Other

## 2018-11-25 NOTE — Telephone Encounter (Signed)
Left pt detailed msg. 

## 2018-11-30 ENCOUNTER — Other Ambulatory Visit (HOSPITAL_COMMUNITY): Payer: Medicare Other

## 2018-11-30 ENCOUNTER — Ambulatory Visit: Admit: 2018-11-30 | Payer: Medicare Other

## 2018-11-30 DIAGNOSIS — I10 Essential (primary) hypertension: Secondary | ICD-10-CM | POA: Diagnosis not present

## 2018-11-30 DIAGNOSIS — G609 Hereditary and idiopathic neuropathy, unspecified: Secondary | ICD-10-CM | POA: Diagnosis not present

## 2018-11-30 DIAGNOSIS — M4807 Spinal stenosis, lumbosacral region: Secondary | ICD-10-CM | POA: Diagnosis not present

## 2018-11-30 DIAGNOSIS — M961 Postlaminectomy syndrome, not elsewhere classified: Secondary | ICD-10-CM | POA: Diagnosis not present

## 2018-11-30 DIAGNOSIS — J310 Chronic rhinitis: Secondary | ICD-10-CM | POA: Diagnosis not present

## 2018-11-30 DIAGNOSIS — F329 Major depressive disorder, single episode, unspecified: Secondary | ICD-10-CM | POA: Diagnosis not present

## 2018-11-30 DIAGNOSIS — Z79899 Other long term (current) drug therapy: Secondary | ICD-10-CM | POA: Diagnosis not present

## 2018-11-30 DIAGNOSIS — G894 Chronic pain syndrome: Secondary | ICD-10-CM | POA: Diagnosis not present

## 2018-11-30 DIAGNOSIS — E785 Hyperlipidemia, unspecified: Secondary | ICD-10-CM | POA: Diagnosis not present

## 2018-11-30 DIAGNOSIS — K219 Gastro-esophageal reflux disease without esophagitis: Secondary | ICD-10-CM | POA: Diagnosis not present

## 2018-11-30 DIAGNOSIS — F17200 Nicotine dependence, unspecified, uncomplicated: Secondary | ICD-10-CM | POA: Diagnosis not present

## 2018-11-30 DIAGNOSIS — M4727 Other spondylosis with radiculopathy, lumbosacral region: Secondary | ICD-10-CM | POA: Diagnosis not present

## 2018-11-30 DIAGNOSIS — M5417 Radiculopathy, lumbosacral region: Secondary | ICD-10-CM | POA: Diagnosis not present

## 2018-11-30 DIAGNOSIS — L603 Nail dystrophy: Secondary | ICD-10-CM | POA: Diagnosis not present

## 2018-11-30 SURGERY — MRI WITH ANESTHESIA
Anesthesia: General

## 2018-12-01 ENCOUNTER — Telehealth: Payer: Self-pay

## 2018-12-01 DIAGNOSIS — K8020 Calculus of gallbladder without cholecystitis without obstruction: Secondary | ICD-10-CM

## 2018-12-01 NOTE — Telephone Encounter (Signed)
I agree, let do referral. Doesn't need to go to the ED unless vomiting or fever or uncontrollable pain.

## 2018-12-01 NOTE — Telephone Encounter (Signed)
OK, I was not even aware he had a CT done earlier this month.  I did open the report in [place] and he does have gallstones. He also had gastroenteritis.  I agree let do referral instead of going back to the ED.  New ref placed.  Please tell Jenny Reichmann urgent.

## 2018-12-01 NOTE — Telephone Encounter (Signed)
Patient called to let Dr Madilyn Fireman know that he was seen at [facility] and saw a doctor there (not his original doctor and cannot remember this provider's name). He states he was told that he has gallstones and that is what is causing his upset stomach issues. Gallstones were seen on CT that was ordered in [facility].  Patient states that the doctor he saw at the pain clinic told him to go to the ER to get his gallstones taken care of because Dr Madilyn Fireman is "overworked" and "has too many patient to take care of this".   Patient felt very uneasy about this and wanted to discuss with Dr Madilyn Fireman. He would rather just get a referral to take care of gallstones instead of presenting to ER again.   Please advise

## 2018-12-02 NOTE — Telephone Encounter (Signed)
Called and left pt msg advising referral being placed.   Jenny Reichmann, please see urgent referral

## 2018-12-02 NOTE — Telephone Encounter (Signed)
Done

## 2018-12-06 DIAGNOSIS — K8020 Calculus of gallbladder without cholecystitis without obstruction: Secondary | ICD-10-CM | POA: Diagnosis not present

## 2018-12-07 ENCOUNTER — Telehealth: Payer: Self-pay

## 2018-12-07 NOTE — Telephone Encounter (Signed)
[name] called and states the GI wants to remove his gall bladder. He wanted to know if Dr Madilyn Fireman agreed with the removal of the gall bladder. He also wants to switch to a new pain management clinic.

## 2018-12-08 NOTE — Telephone Encounter (Signed)
Where did he go to GI. I don't see the notes.

## 2018-12-08 NOTE — Telephone Encounter (Signed)
It appears he went to:   [facility]  [place] [address] [place] [address]  [phone]  Ermalinda Memos, [place]  [place], [place] [address]  [phone]  [phone] (Fax)   They want to remove the gallbladder due to gallstones.

## 2018-12-13 DIAGNOSIS — M5416 Radiculopathy, lumbar region: Secondary | ICD-10-CM | POA: Diagnosis not present

## 2018-12-14 NOTE — Telephone Encounter (Signed)
I do think Dr. Arvin Collard is a good surgeon and I do think it is a good idea to go ahead and have the gallbladder removed.  It can certainly cause nausea, abdominal pain and decreased appetite if the gallstones are blocking the duct.

## 2018-12-15 NOTE — Telephone Encounter (Signed)
Left pt msg with providers recommnedations

## 2018-12-17 DIAGNOSIS — Z01818 Encounter for other preprocedural examination: Secondary | ICD-10-CM | POA: Diagnosis not present

## 2018-12-21 DIAGNOSIS — K219 Gastro-esophageal reflux disease without esophagitis: Secondary | ICD-10-CM | POA: Diagnosis not present

## 2018-12-21 DIAGNOSIS — Z6826 Body mass index (BMI) 26.0-26.9, adult: Secondary | ICD-10-CM | POA: Diagnosis not present

## 2018-12-21 DIAGNOSIS — F329 Major depressive disorder, single episode, unspecified: Secondary | ICD-10-CM | POA: Diagnosis not present

## 2018-12-21 DIAGNOSIS — F17200 Nicotine dependence, unspecified, uncomplicated: Secondary | ICD-10-CM | POA: Diagnosis not present

## 2018-12-21 DIAGNOSIS — I10 Essential (primary) hypertension: Secondary | ICD-10-CM | POA: Diagnosis not present

## 2018-12-21 DIAGNOSIS — M4727 Other spondylosis with radiculopathy, lumbosacral region: Secondary | ICD-10-CM | POA: Diagnosis not present

## 2018-12-21 DIAGNOSIS — M5116 Intervertebral disc disorders with radiculopathy, lumbar region: Secondary | ICD-10-CM | POA: Diagnosis not present

## 2018-12-21 DIAGNOSIS — K8020 Calculus of gallbladder without cholecystitis without obstruction: Secondary | ICD-10-CM | POA: Diagnosis not present

## 2018-12-21 DIAGNOSIS — E785 Hyperlipidemia, unspecified: Secondary | ICD-10-CM | POA: Diagnosis not present

## 2018-12-21 DIAGNOSIS — Z7951 Long term (current) use of inhaled steroids: Secondary | ICD-10-CM | POA: Diagnosis not present

## 2018-12-21 DIAGNOSIS — G8929 Other chronic pain: Secondary | ICD-10-CM | POA: Diagnosis not present

## 2018-12-21 DIAGNOSIS — E669 Obesity, unspecified: Secondary | ICD-10-CM | POA: Diagnosis not present

## 2018-12-21 DIAGNOSIS — Z791 Long term (current) use of non-steroidal anti-inflammatories (NSAID): Secondary | ICD-10-CM | POA: Diagnosis not present

## 2018-12-21 DIAGNOSIS — M1990 Unspecified osteoarthritis, unspecified site: Secondary | ICD-10-CM | POA: Diagnosis not present

## 2018-12-21 DIAGNOSIS — Z79899 Other long term (current) drug therapy: Secondary | ICD-10-CM | POA: Diagnosis not present

## 2019-01-04 DIAGNOSIS — R109 Unspecified abdominal pain: Secondary | ICD-10-CM | POA: Diagnosis not present

## 2019-01-10 DIAGNOSIS — M5416 Radiculopathy, lumbar region: Secondary | ICD-10-CM | POA: Diagnosis not present

## 2019-01-10 DIAGNOSIS — M549 Dorsalgia, unspecified: Secondary | ICD-10-CM | POA: Diagnosis not present

## 2019-01-10 DIAGNOSIS — G894 Chronic pain syndrome: Secondary | ICD-10-CM | POA: Diagnosis not present

## 2019-01-14 DIAGNOSIS — K219 Gastro-esophageal reflux disease without esophagitis: Secondary | ICD-10-CM | POA: Diagnosis not present

## 2019-01-14 DIAGNOSIS — R1033 Periumbilical pain: Secondary | ICD-10-CM | POA: Diagnosis not present

## 2019-01-14 DIAGNOSIS — M5442 Lumbago with sciatica, left side: Secondary | ICD-10-CM | POA: Diagnosis not present

## 2019-01-14 DIAGNOSIS — K582 Mixed irritable bowel syndrome: Secondary | ICD-10-CM | POA: Diagnosis not present

## 2019-01-15 DIAGNOSIS — F329 Major depressive disorder, single episode, unspecified: Secondary | ICD-10-CM | POA: Diagnosis not present

## 2019-01-15 DIAGNOSIS — M961 Postlaminectomy syndrome, not elsewhere classified: Secondary | ICD-10-CM | POA: Diagnosis not present

## 2019-01-15 DIAGNOSIS — Z9181 History of falling: Secondary | ICD-10-CM | POA: Diagnosis not present

## 2019-01-15 DIAGNOSIS — M1990 Unspecified osteoarthritis, unspecified site: Secondary | ICD-10-CM | POA: Diagnosis not present

## 2019-01-15 DIAGNOSIS — M5116 Intervertebral disc disorders with radiculopathy, lumbar region: Secondary | ICD-10-CM | POA: Diagnosis not present

## 2019-01-15 DIAGNOSIS — K219 Gastro-esophageal reflux disease without esophagitis: Secondary | ICD-10-CM | POA: Diagnosis not present

## 2019-01-15 DIAGNOSIS — R32 Unspecified urinary incontinence: Secondary | ICD-10-CM | POA: Diagnosis not present

## 2019-01-15 DIAGNOSIS — M48061 Spinal stenosis, lumbar region without neurogenic claudication: Secondary | ICD-10-CM | POA: Diagnosis not present

## 2019-01-15 DIAGNOSIS — I10 Essential (primary) hypertension: Secondary | ICD-10-CM | POA: Diagnosis not present

## 2019-01-15 DIAGNOSIS — F17210 Nicotine dependence, cigarettes, uncomplicated: Secondary | ICD-10-CM | POA: Diagnosis not present

## 2019-01-18 DIAGNOSIS — M1990 Unspecified osteoarthritis, unspecified site: Secondary | ICD-10-CM | POA: Diagnosis not present

## 2019-01-18 DIAGNOSIS — F329 Major depressive disorder, single episode, unspecified: Secondary | ICD-10-CM | POA: Diagnosis not present

## 2019-01-18 DIAGNOSIS — M48061 Spinal stenosis, lumbar region without neurogenic claudication: Secondary | ICD-10-CM | POA: Diagnosis not present

## 2019-01-18 DIAGNOSIS — M5116 Intervertebral disc disorders with radiculopathy, lumbar region: Secondary | ICD-10-CM | POA: Diagnosis not present

## 2019-01-18 DIAGNOSIS — M961 Postlaminectomy syndrome, not elsewhere classified: Secondary | ICD-10-CM | POA: Diagnosis not present

## 2019-01-18 DIAGNOSIS — I10 Essential (primary) hypertension: Secondary | ICD-10-CM | POA: Diagnosis not present

## 2019-01-19 DIAGNOSIS — F329 Major depressive disorder, single episode, unspecified: Secondary | ICD-10-CM | POA: Diagnosis not present

## 2019-01-19 DIAGNOSIS — I10 Essential (primary) hypertension: Secondary | ICD-10-CM | POA: Diagnosis not present

## 2019-01-19 DIAGNOSIS — M5116 Intervertebral disc disorders with radiculopathy, lumbar region: Secondary | ICD-10-CM | POA: Diagnosis not present

## 2019-01-19 DIAGNOSIS — M48061 Spinal stenosis, lumbar region without neurogenic claudication: Secondary | ICD-10-CM | POA: Diagnosis not present

## 2019-01-19 DIAGNOSIS — M961 Postlaminectomy syndrome, not elsewhere classified: Secondary | ICD-10-CM | POA: Diagnosis not present

## 2019-01-19 DIAGNOSIS — M1990 Unspecified osteoarthritis, unspecified site: Secondary | ICD-10-CM | POA: Diagnosis not present

## 2019-01-20 DIAGNOSIS — M1990 Unspecified osteoarthritis, unspecified site: Secondary | ICD-10-CM | POA: Diagnosis not present

## 2019-01-20 DIAGNOSIS — I10 Essential (primary) hypertension: Secondary | ICD-10-CM | POA: Diagnosis not present

## 2019-01-20 DIAGNOSIS — M5442 Lumbago with sciatica, left side: Secondary | ICD-10-CM | POA: Diagnosis not present

## 2019-01-20 DIAGNOSIS — F329 Major depressive disorder, single episode, unspecified: Secondary | ICD-10-CM | POA: Diagnosis not present

## 2019-01-20 DIAGNOSIS — G894 Chronic pain syndrome: Secondary | ICD-10-CM | POA: Diagnosis not present

## 2019-01-20 DIAGNOSIS — M5417 Radiculopathy, lumbosacral region: Secondary | ICD-10-CM | POA: Diagnosis not present

## 2019-01-20 DIAGNOSIS — M961 Postlaminectomy syndrome, not elsewhere classified: Secondary | ICD-10-CM | POA: Diagnosis not present

## 2019-01-20 DIAGNOSIS — Z79899 Other long term (current) drug therapy: Secondary | ICD-10-CM | POA: Diagnosis not present

## 2019-01-20 DIAGNOSIS — M4807 Spinal stenosis, lumbosacral region: Secondary | ICD-10-CM | POA: Diagnosis not present

## 2019-01-20 DIAGNOSIS — F17200 Nicotine dependence, unspecified, uncomplicated: Secondary | ICD-10-CM | POA: Diagnosis not present

## 2019-01-20 DIAGNOSIS — M5416 Radiculopathy, lumbar region: Secondary | ICD-10-CM | POA: Diagnosis not present

## 2019-01-20 DIAGNOSIS — E785 Hyperlipidemia, unspecified: Secondary | ICD-10-CM | POA: Diagnosis not present

## 2019-01-20 DIAGNOSIS — G608 Other hereditary and idiopathic neuropathies: Secondary | ICD-10-CM | POA: Diagnosis not present

## 2019-01-20 DIAGNOSIS — K219 Gastro-esophageal reflux disease without esophagitis: Secondary | ICD-10-CM | POA: Diagnosis not present

## 2019-01-21 DIAGNOSIS — M48061 Spinal stenosis, lumbar region without neurogenic claudication: Secondary | ICD-10-CM | POA: Diagnosis not present

## 2019-01-21 DIAGNOSIS — F329 Major depressive disorder, single episode, unspecified: Secondary | ICD-10-CM | POA: Diagnosis not present

## 2019-01-21 DIAGNOSIS — M961 Postlaminectomy syndrome, not elsewhere classified: Secondary | ICD-10-CM | POA: Diagnosis not present

## 2019-01-21 DIAGNOSIS — I10 Essential (primary) hypertension: Secondary | ICD-10-CM | POA: Diagnosis not present

## 2019-01-21 DIAGNOSIS — M5116 Intervertebral disc disorders with radiculopathy, lumbar region: Secondary | ICD-10-CM | POA: Diagnosis not present

## 2019-01-21 DIAGNOSIS — M1990 Unspecified osteoarthritis, unspecified site: Secondary | ICD-10-CM | POA: Diagnosis not present

## 2019-01-25 DIAGNOSIS — E669 Obesity, unspecified: Secondary | ICD-10-CM | POA: Diagnosis not present

## 2019-01-25 DIAGNOSIS — M48061 Spinal stenosis, lumbar region without neurogenic claudication: Secondary | ICD-10-CM | POA: Diagnosis not present

## 2019-01-25 DIAGNOSIS — Z20828 Contact with and (suspected) exposure to other viral communicable diseases: Secondary | ICD-10-CM | POA: Diagnosis not present

## 2019-01-25 DIAGNOSIS — R112 Nausea with vomiting, unspecified: Secondary | ICD-10-CM | POA: Diagnosis not present

## 2019-01-25 DIAGNOSIS — N400 Enlarged prostate without lower urinary tract symptoms: Secondary | ICD-10-CM | POA: Diagnosis not present

## 2019-01-25 DIAGNOSIS — M1990 Unspecified osteoarthritis, unspecified site: Secondary | ICD-10-CM | POA: Diagnosis not present

## 2019-01-25 DIAGNOSIS — Z6826 Body mass index (BMI) 26.0-26.9, adult: Secondary | ICD-10-CM | POA: Diagnosis not present

## 2019-01-25 DIAGNOSIS — F329 Major depressive disorder, single episode, unspecified: Secondary | ICD-10-CM | POA: Diagnosis not present

## 2019-01-25 DIAGNOSIS — I10 Essential (primary) hypertension: Secondary | ICD-10-CM | POA: Diagnosis not present

## 2019-01-25 DIAGNOSIS — K8689 Other specified diseases of pancreas: Secondary | ICD-10-CM | POA: Diagnosis not present

## 2019-01-25 DIAGNOSIS — M5116 Intervertebral disc disorders with radiculopathy, lumbar region: Secondary | ICD-10-CM | POA: Diagnosis not present

## 2019-01-25 DIAGNOSIS — M545 Low back pain: Secondary | ICD-10-CM | POA: Diagnosis not present

## 2019-01-25 DIAGNOSIS — K36 Other appendicitis: Secondary | ICD-10-CM | POA: Diagnosis not present

## 2019-01-25 DIAGNOSIS — J069 Acute upper respiratory infection, unspecified: Secondary | ICD-10-CM | POA: Diagnosis not present

## 2019-01-25 DIAGNOSIS — Z7951 Long term (current) use of inhaled steroids: Secondary | ICD-10-CM | POA: Diagnosis not present

## 2019-01-25 DIAGNOSIS — Z79899 Other long term (current) drug therapy: Secondary | ICD-10-CM | POA: Diagnosis not present

## 2019-01-25 DIAGNOSIS — R52 Pain, unspecified: Secondary | ICD-10-CM | POA: Diagnosis not present

## 2019-01-25 DIAGNOSIS — Z23 Encounter for immunization: Secondary | ICD-10-CM | POA: Diagnosis not present

## 2019-01-25 DIAGNOSIS — E785 Hyperlipidemia, unspecified: Secondary | ICD-10-CM | POA: Diagnosis not present

## 2019-01-25 DIAGNOSIS — K219 Gastro-esophageal reflux disease without esophagitis: Secondary | ICD-10-CM | POA: Diagnosis not present

## 2019-01-25 DIAGNOSIS — D3502 Benign neoplasm of left adrenal gland: Secondary | ICD-10-CM | POA: Diagnosis not present

## 2019-01-25 DIAGNOSIS — F17290 Nicotine dependence, other tobacco product, uncomplicated: Secondary | ICD-10-CM | POA: Diagnosis not present

## 2019-01-25 DIAGNOSIS — R110 Nausea: Secondary | ICD-10-CM | POA: Diagnosis not present

## 2019-01-25 DIAGNOSIS — R4182 Altered mental status, unspecified: Secondary | ICD-10-CM | POA: Diagnosis not present

## 2019-01-25 DIAGNOSIS — K37 Unspecified appendicitis: Secondary | ICD-10-CM | POA: Diagnosis not present

## 2019-01-25 DIAGNOSIS — Z8719 Personal history of other diseases of the digestive system: Secondary | ICD-10-CM | POA: Diagnosis not present

## 2019-01-25 DIAGNOSIS — G8929 Other chronic pain: Secondary | ICD-10-CM | POA: Diagnosis not present

## 2019-01-25 DIAGNOSIS — R1084 Generalized abdominal pain: Secondary | ICD-10-CM | POA: Diagnosis not present

## 2019-01-25 DIAGNOSIS — K838 Other specified diseases of biliary tract: Secondary | ICD-10-CM | POA: Diagnosis not present

## 2019-01-25 DIAGNOSIS — M961 Postlaminectomy syndrome, not elsewhere classified: Secondary | ICD-10-CM | POA: Diagnosis not present

## 2019-01-26 DIAGNOSIS — E669 Obesity, unspecified: Secondary | ICD-10-CM | POA: Diagnosis not present

## 2019-01-26 DIAGNOSIS — Z6826 Body mass index (BMI) 26.0-26.9, adult: Secondary | ICD-10-CM | POA: Diagnosis not present

## 2019-01-26 DIAGNOSIS — K37 Unspecified appendicitis: Secondary | ICD-10-CM | POA: Diagnosis not present

## 2019-01-26 DIAGNOSIS — I10 Essential (primary) hypertension: Secondary | ICD-10-CM | POA: Diagnosis not present

## 2019-01-26 DIAGNOSIS — F17290 Nicotine dependence, other tobacco product, uncomplicated: Secondary | ICD-10-CM | POA: Diagnosis not present

## 2019-01-26 DIAGNOSIS — K388 Other specified diseases of appendix: Secondary | ICD-10-CM | POA: Diagnosis not present

## 2019-01-26 DIAGNOSIS — K219 Gastro-esophageal reflux disease without esophagitis: Secondary | ICD-10-CM | POA: Diagnosis not present

## 2019-01-26 DIAGNOSIS — K6389 Other specified diseases of intestine: Secondary | ICD-10-CM | POA: Diagnosis not present

## 2019-01-28 DIAGNOSIS — M1990 Unspecified osteoarthritis, unspecified site: Secondary | ICD-10-CM | POA: Diagnosis not present

## 2019-01-28 DIAGNOSIS — F329 Major depressive disorder, single episode, unspecified: Secondary | ICD-10-CM | POA: Diagnosis not present

## 2019-01-28 DIAGNOSIS — M5116 Intervertebral disc disorders with radiculopathy, lumbar region: Secondary | ICD-10-CM | POA: Diagnosis not present

## 2019-01-28 DIAGNOSIS — M48061 Spinal stenosis, lumbar region without neurogenic claudication: Secondary | ICD-10-CM | POA: Diagnosis not present

## 2019-01-28 DIAGNOSIS — I10 Essential (primary) hypertension: Secondary | ICD-10-CM | POA: Diagnosis not present

## 2019-01-28 DIAGNOSIS — M961 Postlaminectomy syndrome, not elsewhere classified: Secondary | ICD-10-CM | POA: Diagnosis not present

## 2019-02-01 DIAGNOSIS — M48061 Spinal stenosis, lumbar region without neurogenic claudication: Secondary | ICD-10-CM | POA: Diagnosis not present

## 2019-02-01 DIAGNOSIS — I10 Essential (primary) hypertension: Secondary | ICD-10-CM | POA: Diagnosis not present

## 2019-02-01 DIAGNOSIS — M961 Postlaminectomy syndrome, not elsewhere classified: Secondary | ICD-10-CM | POA: Diagnosis not present

## 2019-02-01 DIAGNOSIS — M5116 Intervertebral disc disorders with radiculopathy, lumbar region: Secondary | ICD-10-CM | POA: Diagnosis not present

## 2019-02-01 DIAGNOSIS — F329 Major depressive disorder, single episode, unspecified: Secondary | ICD-10-CM | POA: Diagnosis not present

## 2019-02-01 DIAGNOSIS — M1990 Unspecified osteoarthritis, unspecified site: Secondary | ICD-10-CM | POA: Diagnosis not present

## 2019-02-02 ENCOUNTER — Telehealth: Payer: Self-pay

## 2019-02-02 DIAGNOSIS — F329 Major depressive disorder, single episode, unspecified: Secondary | ICD-10-CM | POA: Diagnosis not present

## 2019-02-02 DIAGNOSIS — M961 Postlaminectomy syndrome, not elsewhere classified: Secondary | ICD-10-CM | POA: Diagnosis not present

## 2019-02-02 DIAGNOSIS — M5116 Intervertebral disc disorders with radiculopathy, lumbar region: Secondary | ICD-10-CM | POA: Diagnosis not present

## 2019-02-02 DIAGNOSIS — M1990 Unspecified osteoarthritis, unspecified site: Secondary | ICD-10-CM | POA: Diagnosis not present

## 2019-02-02 DIAGNOSIS — M48061 Spinal stenosis, lumbar region without neurogenic claudication: Secondary | ICD-10-CM | POA: Diagnosis not present

## 2019-02-02 DIAGNOSIS — I10 Essential (primary) hypertension: Secondary | ICD-10-CM | POA: Diagnosis not present

## 2019-02-02 MED ORDER — AMLODIPINE BESYLATE 5 MG PO TABS: 5.0000 mg | ORAL_TABLET | Freq: Every day | ORAL | 3 refills | Status: DC

## 2019-02-02 NOTE — Telephone Encounter (Signed)
Pt advised and will try to get labs done by end of wk or early next week.Maryruth Eve, Lahoma Crocker, CMA

## 2019-02-02 NOTE — Telephone Encounter (Signed)
Please tell him thank you for letting us know.  MS 1010 over amlodipine 5 mg to take daily to start with.  I really need to get up-to-date blood work on him as it limits our choices for blood pressure medications without knowing his kidney and renal function.

## 2019-02-02 NOTE — Telephone Encounter (Signed)
[name] states the home health checked his blood pressure. Blood pressure was 150/90. He states he is under a lot of stress and has a lot of pain. Denies chest pain, shortness of breath or headaches. He isn't on blood pressure medication. He was on blood pressure medication in the past. Advised to follow a DASH diet. Please advise.

## 2019-02-04 DIAGNOSIS — M48061 Spinal stenosis, lumbar region without neurogenic claudication: Secondary | ICD-10-CM | POA: Diagnosis not present

## 2019-02-04 DIAGNOSIS — M5116 Intervertebral disc disorders with radiculopathy, lumbar region: Secondary | ICD-10-CM | POA: Diagnosis not present

## 2019-02-04 DIAGNOSIS — I10 Essential (primary) hypertension: Secondary | ICD-10-CM | POA: Diagnosis not present

## 2019-02-04 DIAGNOSIS — M1990 Unspecified osteoarthritis, unspecified site: Secondary | ICD-10-CM | POA: Diagnosis not present

## 2019-02-04 DIAGNOSIS — M961 Postlaminectomy syndrome, not elsewhere classified: Secondary | ICD-10-CM | POA: Diagnosis not present

## 2019-02-04 DIAGNOSIS — F329 Major depressive disorder, single episode, unspecified: Secondary | ICD-10-CM | POA: Diagnosis not present

## 2019-02-07 DIAGNOSIS — M5416 Radiculopathy, lumbar region: Secondary | ICD-10-CM | POA: Diagnosis not present

## 2019-02-07 DIAGNOSIS — M549 Dorsalgia, unspecified: Secondary | ICD-10-CM | POA: Diagnosis not present

## 2019-02-07 DIAGNOSIS — G894 Chronic pain syndrome: Secondary | ICD-10-CM | POA: Diagnosis not present

## 2019-02-08 DIAGNOSIS — F329 Major depressive disorder, single episode, unspecified: Secondary | ICD-10-CM | POA: Diagnosis not present

## 2019-02-08 DIAGNOSIS — M1990 Unspecified osteoarthritis, unspecified site: Secondary | ICD-10-CM | POA: Diagnosis not present

## 2019-02-08 DIAGNOSIS — M961 Postlaminectomy syndrome, not elsewhere classified: Secondary | ICD-10-CM | POA: Diagnosis not present

## 2019-02-08 DIAGNOSIS — M48061 Spinal stenosis, lumbar region without neurogenic claudication: Secondary | ICD-10-CM | POA: Diagnosis not present

## 2019-02-08 DIAGNOSIS — M5116 Intervertebral disc disorders with radiculopathy, lumbar region: Secondary | ICD-10-CM | POA: Diagnosis not present

## 2019-02-08 DIAGNOSIS — I10 Essential (primary) hypertension: Secondary | ICD-10-CM | POA: Diagnosis not present

## 2019-02-09 DIAGNOSIS — M961 Postlaminectomy syndrome, not elsewhere classified: Secondary | ICD-10-CM | POA: Diagnosis not present

## 2019-02-09 DIAGNOSIS — I10 Essential (primary) hypertension: Secondary | ICD-10-CM | POA: Diagnosis not present

## 2019-02-09 DIAGNOSIS — M5116 Intervertebral disc disorders with radiculopathy, lumbar region: Secondary | ICD-10-CM | POA: Diagnosis not present

## 2019-02-09 DIAGNOSIS — F329 Major depressive disorder, single episode, unspecified: Secondary | ICD-10-CM | POA: Diagnosis not present

## 2019-02-09 DIAGNOSIS — M48061 Spinal stenosis, lumbar region without neurogenic claudication: Secondary | ICD-10-CM | POA: Diagnosis not present

## 2019-02-09 DIAGNOSIS — M1990 Unspecified osteoarthritis, unspecified site: Secondary | ICD-10-CM | POA: Diagnosis not present

## 2019-02-14 DIAGNOSIS — M48061 Spinal stenosis, lumbar region without neurogenic claudication: Secondary | ICD-10-CM | POA: Diagnosis not present

## 2019-02-14 DIAGNOSIS — K219 Gastro-esophageal reflux disease without esophagitis: Secondary | ICD-10-CM | POA: Diagnosis not present

## 2019-02-14 DIAGNOSIS — M961 Postlaminectomy syndrome, not elsewhere classified: Secondary | ICD-10-CM | POA: Diagnosis not present

## 2019-02-14 DIAGNOSIS — I10 Essential (primary) hypertension: Secondary | ICD-10-CM | POA: Diagnosis not present

## 2019-02-14 DIAGNOSIS — M5116 Intervertebral disc disorders with radiculopathy, lumbar region: Secondary | ICD-10-CM | POA: Diagnosis not present

## 2019-02-14 DIAGNOSIS — R32 Unspecified urinary incontinence: Secondary | ICD-10-CM | POA: Diagnosis not present

## 2019-02-14 DIAGNOSIS — F17210 Nicotine dependence, cigarettes, uncomplicated: Secondary | ICD-10-CM | POA: Diagnosis not present

## 2019-02-14 DIAGNOSIS — Z9181 History of falling: Secondary | ICD-10-CM | POA: Diagnosis not present

## 2019-02-14 DIAGNOSIS — M1990 Unspecified osteoarthritis, unspecified site: Secondary | ICD-10-CM | POA: Diagnosis not present

## 2019-02-14 DIAGNOSIS — F329 Major depressive disorder, single episode, unspecified: Secondary | ICD-10-CM | POA: Diagnosis not present

## 2019-02-19 ENCOUNTER — Other Ambulatory Visit: Payer: Self-pay | Admitting: Family Medicine

## 2019-02-19 LAB — CBC
HCT: 44.8 % (ref 38.5–50.0)
Hemoglobin: 15 g/dL (ref 13.2–17.1)
MCH: 32.8 pg (ref 27.0–33.0)
MCHC: 33.5 g/dL (ref 32.0–36.0)
MCV: 97.8 fL (ref 80.0–100.0)
MPV: 10 fL (ref 7.5–12.5)
Platelets: 229 10*3/uL (ref 140–400)
RBC: 4.58 10*6/uL (ref 4.20–5.80)
RDW: 11.6 % (ref 11.0–15.0)
WBC: 5.5 10*3/uL (ref 3.8–10.8)

## 2019-02-19 LAB — LIPID PANEL
Cholesterol: 212 mg/dL — ABNORMAL HIGH (ref <200)
HDL: 61 mg/dL (ref >=40)
LDL Cholesterol (Calc): 127 mg/dL (calc) — ABNORMAL HIGH
Non-HDL Cholesterol (Calc): 151 mg/dL (calc) — ABNORMAL HIGH (ref <130)
Total CHOL/HDL Ratio: 3.5 (calc) (ref <5.0)
Triglycerides: 125 mg/dL (ref <150)

## 2019-02-19 LAB — COMPLETE METABOLIC PANEL WITH GFR
AG Ratio: 1.8 (calc) (ref 1.0–2.5)
ALT: 12 U/L (ref 9–46)
AST: 16 U/L (ref 10–35)
Albumin: 4.1 g/dL (ref 3.6–5.1)
Alkaline phosphatase (APISO): 87 U/L (ref 35–144)
BUN: 13 mg/dL (ref 7–25)
CO2: 29 mmol/L (ref 20–32)
Calcium: 9.2 mg/dL (ref 8.6–10.3)
Chloride: 104 mmol/L (ref 98–110)
Creat: 0.8 mg/dL (ref 0.70–1.25)
GFR, Est African American: 106 mL/min/{1.73_m2} (ref >=60)
GFR, Est Non African American: 92 mL/min/{1.73_m2} (ref >=60)
Globulin: 2.3 g/dL (calc) (ref 1.9–3.7)
Glucose, Bld: 91 mg/dL (ref 65–99)
Potassium: 4.1 mmol/L (ref 3.5–5.3)
Sodium: 141 mmol/L (ref 135–146)
Total Bilirubin: 0.4 mg/dL (ref 0.2–1.2)
Total Protein: 6.4 g/dL (ref 6.1–8.1)

## 2019-02-21 DIAGNOSIS — F329 Major depressive disorder, single episode, unspecified: Secondary | ICD-10-CM | POA: Diagnosis not present

## 2019-02-21 DIAGNOSIS — M961 Postlaminectomy syndrome, not elsewhere classified: Secondary | ICD-10-CM | POA: Diagnosis not present

## 2019-02-21 DIAGNOSIS — I10 Essential (primary) hypertension: Secondary | ICD-10-CM | POA: Diagnosis not present

## 2019-02-21 DIAGNOSIS — M48061 Spinal stenosis, lumbar region without neurogenic claudication: Secondary | ICD-10-CM | POA: Diagnosis not present

## 2019-02-21 DIAGNOSIS — M5116 Intervertebral disc disorders with radiculopathy, lumbar region: Secondary | ICD-10-CM | POA: Diagnosis not present

## 2019-02-21 DIAGNOSIS — M1990 Unspecified osteoarthritis, unspecified site: Secondary | ICD-10-CM | POA: Diagnosis not present

## 2019-03-01 DIAGNOSIS — M5116 Intervertebral disc disorders with radiculopathy, lumbar region: Secondary | ICD-10-CM | POA: Diagnosis not present

## 2019-03-01 DIAGNOSIS — M1990 Unspecified osteoarthritis, unspecified site: Secondary | ICD-10-CM | POA: Diagnosis not present

## 2019-03-01 DIAGNOSIS — M961 Postlaminectomy syndrome, not elsewhere classified: Secondary | ICD-10-CM | POA: Diagnosis not present

## 2019-03-01 DIAGNOSIS — I10 Essential (primary) hypertension: Secondary | ICD-10-CM | POA: Diagnosis not present

## 2019-03-01 DIAGNOSIS — M48061 Spinal stenosis, lumbar region without neurogenic claudication: Secondary | ICD-10-CM | POA: Diagnosis not present

## 2019-03-01 DIAGNOSIS — F329 Major depressive disorder, single episode, unspecified: Secondary | ICD-10-CM | POA: Diagnosis not present

## 2019-03-07 ENCOUNTER — Other Ambulatory Visit: Payer: Self-pay | Admitting: Family Medicine

## 2019-03-07 DIAGNOSIS — M62838 Other muscle spasm: Secondary | ICD-10-CM

## 2019-04-04 DIAGNOSIS — G894 Chronic pain syndrome: Secondary | ICD-10-CM | POA: Diagnosis not present

## 2019-04-04 DIAGNOSIS — M549 Dorsalgia, unspecified: Secondary | ICD-10-CM | POA: Diagnosis not present

## 2019-04-04 DIAGNOSIS — M5416 Radiculopathy, lumbar region: Secondary | ICD-10-CM | POA: Diagnosis not present

## 2019-04-04 DIAGNOSIS — M5418 Radiculopathy, sacral and sacrococcygeal region: Secondary | ICD-10-CM | POA: Diagnosis not present

## 2019-04-13 DIAGNOSIS — R197 Diarrhea, unspecified: Secondary | ICD-10-CM | POA: Diagnosis not present

## 2019-04-13 DIAGNOSIS — Z9049 Acquired absence of other specified parts of digestive tract: Secondary | ICD-10-CM | POA: Diagnosis not present

## 2019-04-13 DIAGNOSIS — K219 Gastro-esophageal reflux disease without esophagitis: Secondary | ICD-10-CM | POA: Diagnosis not present

## 2019-04-13 DIAGNOSIS — R1033 Periumbilical pain: Secondary | ICD-10-CM | POA: Diagnosis not present

## 2019-04-14 DIAGNOSIS — Z87438 Personal history of other diseases of male genital organs: Secondary | ICD-10-CM | POA: Diagnosis not present

## 2019-04-14 DIAGNOSIS — K219 Gastro-esophageal reflux disease without esophagitis: Secondary | ICD-10-CM | POA: Diagnosis not present

## 2019-04-14 DIAGNOSIS — Z9049 Acquired absence of other specified parts of digestive tract: Secondary | ICD-10-CM | POA: Diagnosis not present

## 2019-04-14 DIAGNOSIS — I10 Essential (primary) hypertension: Secondary | ICD-10-CM | POA: Diagnosis not present

## 2019-04-14 DIAGNOSIS — F17210 Nicotine dependence, cigarettes, uncomplicated: Secondary | ICD-10-CM | POA: Diagnosis not present

## 2019-04-14 DIAGNOSIS — G609 Hereditary and idiopathic neuropathy, unspecified: Secondary | ICD-10-CM | POA: Diagnosis not present

## 2019-04-14 DIAGNOSIS — Z915 Personal history of self-harm: Secondary | ICD-10-CM | POA: Diagnosis not present

## 2019-04-14 DIAGNOSIS — M4727 Other spondylosis with radiculopathy, lumbosacral region: Secondary | ICD-10-CM | POA: Diagnosis not present

## 2019-04-14 DIAGNOSIS — Z9852 Vasectomy status: Secondary | ICD-10-CM | POA: Diagnosis not present

## 2019-04-14 DIAGNOSIS — M5417 Radiculopathy, lumbosacral region: Secondary | ICD-10-CM | POA: Diagnosis not present

## 2019-04-14 DIAGNOSIS — Z809 Family history of malignant neoplasm, unspecified: Secondary | ICD-10-CM | POA: Diagnosis not present

## 2019-04-14 DIAGNOSIS — G894 Chronic pain syndrome: Secondary | ICD-10-CM | POA: Diagnosis not present

## 2019-04-14 DIAGNOSIS — Z9889 Other specified postprocedural states: Secondary | ICD-10-CM | POA: Diagnosis not present

## 2019-04-14 DIAGNOSIS — E785 Hyperlipidemia, unspecified: Secondary | ICD-10-CM | POA: Diagnosis not present

## 2019-04-14 DIAGNOSIS — M5116 Intervertebral disc disorders with radiculopathy, lumbar region: Secondary | ICD-10-CM | POA: Diagnosis not present

## 2019-04-14 DIAGNOSIS — M48061 Spinal stenosis, lumbar region without neurogenic claudication: Secondary | ICD-10-CM | POA: Diagnosis not present

## 2019-04-14 DIAGNOSIS — M1990 Unspecified osteoarthritis, unspecified site: Secondary | ICD-10-CM | POA: Diagnosis not present

## 2019-04-14 DIAGNOSIS — M961 Postlaminectomy syndrome, not elsewhere classified: Secondary | ICD-10-CM | POA: Diagnosis not present

## 2019-04-14 DIAGNOSIS — M7918 Myalgia, other site: Secondary | ICD-10-CM | POA: Diagnosis not present

## 2019-05-05 ENCOUNTER — Telehealth (INDEPENDENT_AMBULATORY_CARE_PROVIDER_SITE_OTHER): Payer: Medicare Other | Admitting: Family Medicine

## 2019-05-05 DIAGNOSIS — R6889 Other general symptoms and signs: Secondary | ICD-10-CM

## 2019-05-05 DIAGNOSIS — Z5329 Procedure and treatment not carried out because of patient's decision for other reasons: Secondary | ICD-10-CM
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
## 2019-05-20 ENCOUNTER — Other Ambulatory Visit: Payer: Self-pay | Admitting: Family Medicine

## 2019-05-31 ENCOUNTER — Ambulatory Visit: Payer: Medicare Other | Admitting: Family Medicine

## 2019-05-31 DIAGNOSIS — M5442 Lumbago with sciatica, left side: Secondary | ICD-10-CM | POA: Diagnosis not present

## 2019-05-31 DIAGNOSIS — M545 Low back pain: Secondary | ICD-10-CM | POA: Diagnosis not present

## 2019-05-31 DIAGNOSIS — M5417 Radiculopathy, lumbosacral region: Secondary | ICD-10-CM | POA: Diagnosis not present

## 2019-05-31 DIAGNOSIS — M961 Postlaminectomy syndrome, not elsewhere classified: Secondary | ICD-10-CM | POA: Diagnosis not present

## 2019-06-07 ENCOUNTER — Ambulatory Visit (INDEPENDENT_AMBULATORY_CARE_PROVIDER_SITE_OTHER): Payer: Medicare Other | Admitting: Family Medicine

## 2019-06-07 ENCOUNTER — Encounter: Payer: Self-pay | Admitting: Family Medicine

## 2019-06-07 ENCOUNTER — Other Ambulatory Visit: Payer: Self-pay

## 2019-06-07 VITALS — BP 131/85 | HR 89 | Temp 98.7°F | Ht 74.0 in | Wt 221.0 lb

## 2019-06-07 DIAGNOSIS — M961 Postlaminectomy syndrome, not elsewhere classified: Secondary | ICD-10-CM | POA: Diagnosis not present

## 2019-06-07 DIAGNOSIS — W57XXXA Bitten or stung by nonvenomous insect and other nonvenomous arthropods, initial encounter: Secondary | ICD-10-CM | POA: Diagnosis not present

## 2019-06-07 DIAGNOSIS — G8929 Other chronic pain: Secondary | ICD-10-CM | POA: Insufficient documentation

## 2019-06-07 DIAGNOSIS — K529 Noninfective gastroenteritis and colitis, unspecified: Secondary | ICD-10-CM | POA: Diagnosis not present

## 2019-06-07 DIAGNOSIS — L720 Epidermal cyst: Secondary | ICD-10-CM | POA: Insufficient documentation

## 2019-06-07 DIAGNOSIS — S00469A Insect bite (nonvenomous) of unspecified ear, initial encounter: Secondary | ICD-10-CM

## 2019-06-07 DIAGNOSIS — I10 Essential (primary) hypertension: Secondary | ICD-10-CM | POA: Diagnosis not present
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
## 2019-06-07 NOTE — Assessment & Plan Note (Addendum)
BP elevated today.  John Scott blood pressure at goal today.  Stable continue current regimen.  Also reviewed the DASH diet in addition information handout provided.  Also discussed trying to increase his activity level by doing some upper body workout increase his heart rate.

## 2019-06-07 NOTE — Assessment & Plan Note (Signed)
Patient would like referral to a new pain management clinic.  Referral placed per patient preference.

## 2019-06-07 NOTE — Assessment & Plan Note (Signed)
On scalp.  Benign lesions.  Gave reassurance.

## 2019-06-07 NOTE — Patient Instructions (Signed)
DASH Eating Plan DASH stands for "Dietary Approaches to Stop Hypertension." The DASH eating plan is a healthy eating plan that has been shown to reduce high blood pressure (hypertension). It may also reduce your risk for type 2 diabetes, heart disease, and stroke. The DASH eating plan may also help with weight loss. What are tips for following this plan?  General guidelines  Avoid eating more than 2,300 mg (milligrams) of salt (sodium) a day. If you have hypertension, you may need to reduce your sodium intake to 1,500 mg a day.  Limit alcohol intake to no more than 1 drink a day for nonpregnant women and 2 drinks a day for men. One drink equals 12 oz of beer, 5 oz of wine, or 1 oz of hard liquor.  Work with your health care provider to maintain a healthy body weight or to lose weight. Ask what an ideal weight is for you.  Get at least 30 minutes of exercise that causes your heart to beat faster (aerobic exercise) most days of the week. Activities may include walking, swimming, or biking.  Work with your health care provider or diet and nutrition specialist (dietitian) to adjust your eating plan to your individual calorie needs. Reading food labels   Check food labels for the amount of sodium per serving. Choose foods with less than 5 percent of the Daily Value of sodium. Generally, foods with less than 300 mg of sodium per serving fit into this eating plan.  To find whole grains, look for the word "whole" as the first word in the ingredient list. Shopping  Buy products labeled as "low-sodium" or "no salt added."  Buy fresh foods. Avoid canned foods and premade or frozen meals. Cooking  Avoid adding salt when cooking. Use salt-free seasonings or herbs instead of table salt or sea salt. Check with your health care provider or pharmacist before using salt substitutes.  Do not fry foods. Cook foods using healthy methods such as baking, boiling, grilling, and broiling instead.  Cook with  heart-healthy oils, such as olive, canola, soybean, or sunflower oil. Meal planning  Eat a balanced diet that includes: ? 5 or more servings of fruits and vegetables each day. At each meal, try to fill half of your plate with fruits and vegetables. ? Up to 6-8 servings of whole grains each day. ? Less than 6 oz of lean meat, poultry, or fish each day. A 3-oz serving of meat is about the same size as a deck of cards. One egg equals 1 oz. ? 2 servings of low-fat dairy each day. ? A serving of nuts, seeds, or beans 5 times each week. ? Heart-healthy fats. Healthy fats called Omega-3 fatty acids are found in foods such as flaxseeds and coldwater fish, like sardines, salmon, and mackerel.  Limit how much you eat of the following: ? Canned or prepackaged foods. ? Food that is high in trans fat, such as fried foods. ? Food that is high in saturated fat, such as fatty meat. ? Sweets, desserts, sugary drinks, and other foods with added sugar. ? Full-fat dairy products.  Do not salt foods before eating.  Try to eat at least 2 vegetarian meals each week.  Eat more home-cooked food and less restaurant, buffet, and fast food.  When eating at a restaurant, ask that your food be prepared with less salt or no salt, if possible. What foods are recommended? The items listed may not be a complete list. Talk with your dietitian about   what dietary choices are best for you. Grains Whole-grain or whole-wheat bread. Whole-grain or whole-wheat pasta. Brown rice. Oatmeal. Quinoa. Bulgur. Whole-grain and low-sodium cereals. Pita bread. Low-fat, low-sodium crackers. Whole-wheat flour tortillas. Vegetables Fresh or frozen vegetables (raw, steamed, roasted, or grilled). Low-sodium or reduced-sodium tomato and vegetable juice. Low-sodium or reduced-sodium tomato sauce and tomato paste. Low-sodium or reduced-sodium canned vegetables. Fruits All fresh, dried, or frozen fruit. Canned fruit in natural juice (without  added sugar). Meat and other protein foods Skinless chicken or turkey. Ground chicken or turkey. Pork with fat trimmed off. Fish and seafood. Egg whites. Dried beans, peas, or lentils. Unsalted nuts, nut butters, and seeds. Unsalted canned beans. Lean cuts of beef with fat trimmed off. Low-sodium, lean deli meat. Dairy Low-fat (1%) or fat-free (skim) milk. Fat-free, low-fat, or reduced-fat cheeses. Nonfat, low-sodium ricotta or cottage cheese. Low-fat or nonfat yogurt. Low-fat, low-sodium cheese. Fats and oils Soft margarine without trans fats. Vegetable oil. Low-fat, reduced-fat, or light mayonnaise and salad dressings (reduced-sodium). Canola, safflower, olive, soybean, and sunflower oils. Avocado. Seasoning and other foods Herbs. Spices. Seasoning mixes without salt. Unsalted popcorn and pretzels. Fat-free sweets. What foods are not recommended? The items listed may not be a complete list. Talk with your dietitian about what dietary choices are best for you. Grains Baked goods made with fat, such as croissants, muffins, or some breads. Dry pasta or rice meal packs. Vegetables Creamed or fried vegetables. Vegetables in a cheese sauce. Regular canned vegetables (not low-sodium or reduced-sodium). Regular canned tomato sauce and paste (not low-sodium or reduced-sodium). Regular tomato and vegetable juice (not low-sodium or reduced-sodium). Pickles. Olives. Fruits Canned fruit in a light or heavy syrup. Fried fruit. Fruit in cream or butter sauce. Meat and other protein foods Fatty cuts of meat. Ribs. Fried meat. Bacon. Sausage. Bologna and other processed lunch meats. Salami. Fatback. Hotdogs. Bratwurst. Salted nuts and seeds. Canned beans with added salt. Canned or smoked fish. Whole eggs or egg yolks. Chicken or turkey with skin. Dairy Whole or 2% milk, cream, and half-and-half. Whole or full-fat cream cheese. Whole-fat or sweetened yogurt. Full-fat cheese. Nondairy creamers. Whipped toppings.  Processed cheese and cheese spreads. Fats and oils Butter. Stick margarine. Lard. Shortening. Ghee. Bacon fat. Tropical oils, such as coconut, palm kernel, or palm oil. Seasoning and other foods Salted popcorn and pretzels. Onion salt, garlic salt, seasoned salt, table salt, and sea salt. Worcestershire sauce. Tartar sauce. Barbecue sauce. Teriyaki sauce. Soy sauce, including reduced-sodium. Steak sauce. Canned and packaged gravies. Fish sauce. Oyster sauce. Cocktail sauce. Horseradish that you find on the shelf. Ketchup. Mustard. Meat flavorings and tenderizers. Bouillon cubes. Hot sauce and Tabasco sauce. Premade or packaged marinades. Premade or packaged taco seasonings. Relishes. Regular salad dressings. Where to find more information:  National Heart, Lung, and Blood Institute: www.nhlbi.nih.gov  American Heart Association: www.heart.org Summary  The DASH eating plan is a healthy eating plan that has been shown to reduce high blood pressure (hypertension). It may also reduce your risk for type 2 diabetes, heart disease, and stroke.  With the DASH eating plan, you should limit salt (sodium) intake to 2,300 mg a day. If you have hypertension, you may need to reduce your sodium intake to 1,500 mg a day.  When on the DASH eating plan, aim to eat more fresh fruits and vegetables, whole grains, lean proteins, low-fat dairy, and heart-healthy fats.  Work with your health care provider or diet and nutrition specialist (dietitian) to adjust your eating plan to your   individual calorie needs. This information is not intended to replace advice given to you by your health care provider. Make sure you discuss any questions you have with your health care provider. Document Revised: 02/13/2017 Document Reviewed: 02/25/2016 Elsevier Patient Education  2020 Else[employer] Inc.  

## 2019-06-08 DIAGNOSIS — R197 Diarrhea, unspecified: Secondary | ICD-10-CM | POA: Diagnosis not present

## 2019-06-08 DIAGNOSIS — K219 Gastro-esophageal reflux disease without esophagitis: Secondary | ICD-10-CM | POA: Diagnosis not present

## 2019-06-08 DIAGNOSIS — R1010 Upper abdominal pain, unspecified: Secondary | ICD-10-CM | POA: Diagnosis not present

## 2019-06-08 DIAGNOSIS — Z9049 Acquired absence of other specified parts of digestive tract: Secondary | ICD-10-CM | POA: Diagnosis not present

## 2019-06-14 ENCOUNTER — Other Ambulatory Visit: Payer: Self-pay | Admitting: Family Medicine

## 2019-06-16 DIAGNOSIS — F17200 Nicotine dependence, unspecified, uncomplicated: Secondary | ICD-10-CM | POA: Diagnosis not present

## 2019-06-16 DIAGNOSIS — G894 Chronic pain syndrome: Secondary | ICD-10-CM | POA: Diagnosis not present

## 2019-06-16 DIAGNOSIS — M48061 Spinal stenosis, lumbar region without neurogenic claudication: Secondary | ICD-10-CM | POA: Diagnosis not present

## 2019-06-16 DIAGNOSIS — M961 Postlaminectomy syndrome, not elsewhere classified: Secondary | ICD-10-CM | POA: Diagnosis not present

## 2019-06-16 DIAGNOSIS — M5116 Intervertebral disc disorders with radiculopathy, lumbar region: Secondary | ICD-10-CM | POA: Diagnosis not present

## 2019-06-16 DIAGNOSIS — I10 Essential (primary) hypertension: Secondary | ICD-10-CM | POA: Diagnosis not present

## 2019-06-16 DIAGNOSIS — M5417 Radiculopathy, lumbosacral region: Secondary | ICD-10-CM | POA: Diagnosis not present

## 2019-06-16 DIAGNOSIS — F329 Major depressive disorder, single episode, unspecified: Secondary | ICD-10-CM | POA: Diagnosis not present

## 2019-06-16 DIAGNOSIS — G609 Hereditary and idiopathic neuropathy, unspecified: Secondary | ICD-10-CM | POA: Diagnosis not present

## 2019-07-04 DIAGNOSIS — Z9049 Acquired absence of other specified parts of digestive tract: Secondary | ICD-10-CM | POA: Diagnosis not present

## 2019-07-04 DIAGNOSIS — R140 Abdominal distension (gaseous): Secondary | ICD-10-CM | POA: Diagnosis not present

## 2019-07-04 DIAGNOSIS — R1033 Periumbilical pain: Secondary | ICD-10-CM | POA: Diagnosis not present

## 2019-07-04 DIAGNOSIS — R197 Diarrhea, unspecified: Secondary | ICD-10-CM | POA: Diagnosis not present

## 2019-07-26 DIAGNOSIS — M549 Dorsalgia, unspecified: Secondary | ICD-10-CM | POA: Diagnosis not present

## 2019-07-26 DIAGNOSIS — M5442 Lumbago with sciatica, left side: Secondary | ICD-10-CM | POA: Diagnosis not present

## 2019-07-26 DIAGNOSIS — M5417 Radiculopathy, lumbosacral region: Secondary | ICD-10-CM | POA: Diagnosis not present

## 2019-07-26 DIAGNOSIS — Z79899 Other long term (current) drug therapy: Secondary | ICD-10-CM | POA: Diagnosis not present

## 2019-07-26 DIAGNOSIS — Z5181 Encounter for therapeutic drug level monitoring: Secondary | ICD-10-CM | POA: Diagnosis not present

## 2019-07-26 DIAGNOSIS — M961 Postlaminectomy syndrome, not elsewhere classified: Secondary | ICD-10-CM | POA: Diagnosis not present

## 2019-08-02 ENCOUNTER — Telehealth: Payer: Self-pay

## 2019-08-02 DIAGNOSIS — I10 Essential (primary) hypertension: Secondary | ICD-10-CM | POA: Diagnosis not present

## 2019-08-02 DIAGNOSIS — Z87891 Personal history of nicotine dependence: Secondary | ICD-10-CM | POA: Diagnosis not present

## 2019-08-02 DIAGNOSIS — M1990 Unspecified osteoarthritis, unspecified site: Secondary | ICD-10-CM | POA: Diagnosis not present

## 2019-08-02 DIAGNOSIS — E876 Hypokalemia: Secondary | ICD-10-CM | POA: Diagnosis not present

## 2019-08-02 DIAGNOSIS — N390 Urinary tract infection, site not specified: Secondary | ICD-10-CM | POA: Diagnosis not present

## 2019-08-02 DIAGNOSIS — F329 Major depressive disorder, single episode, unspecified: Secondary | ICD-10-CM | POA: Diagnosis not present

## 2019-08-02 DIAGNOSIS — M4802 Spinal stenosis, cervical region: Secondary | ICD-10-CM | POA: Diagnosis not present

## 2019-08-02 DIAGNOSIS — R519 Headache, unspecified: Secondary | ICD-10-CM | POA: Diagnosis not present

## 2019-08-02 DIAGNOSIS — Z79899 Other long term (current) drug therapy: Secondary | ICD-10-CM | POA: Diagnosis not present

## 2019-08-02 DIAGNOSIS — Z791 Long term (current) use of non-steroidal anti-inflammatories (NSAID): Secondary | ICD-10-CM | POA: Diagnosis not present

## 2019-08-02 DIAGNOSIS — M47812 Spondylosis without myelopathy or radiculopathy, cervical region: Secondary | ICD-10-CM | POA: Diagnosis not present

## 2019-08-02 DIAGNOSIS — M5031 Other cervical disc degeneration,  high cervical region: Secondary | ICD-10-CM | POA: Diagnosis not present

## 2019-08-02 DIAGNOSIS — M5021 Other cervical disc displacement,  high cervical region: Secondary | ICD-10-CM | POA: Diagnosis not present

## 2019-08-02 DIAGNOSIS — Z993 Dependence on wheelchair: Secondary | ICD-10-CM | POA: Diagnosis not present

## 2019-08-02 DIAGNOSIS — R42 Dizziness and giddiness: Secondary | ICD-10-CM | POA: Diagnosis not present

## 2019-08-02 DIAGNOSIS — R531 Weakness: Secondary | ICD-10-CM | POA: Diagnosis not present

## 2019-08-02 DIAGNOSIS — K219 Gastro-esophageal reflux disease without esophagitis: Secondary | ICD-10-CM | POA: Diagnosis not present

## 2019-08-02 DIAGNOSIS — R1084 Generalized abdominal pain: Secondary | ICD-10-CM | POA: Diagnosis not present

## 2019-08-02 NOTE — Telephone Encounter (Signed)
Agree with above 

## 2019-08-02 NOTE — Telephone Encounter (Signed)
[name] called and states he had slurred speech and loss of consciousness I advised patient to have someone take him to the ED ASAP. I asked if I needed [place] to call EMS to his home. He stated he did not but would go to the ED.

## 2019-08-26 ENCOUNTER — Telehealth: Payer: Self-pay | Admitting: Family Medicine

## 2019-08-26 NOTE — Telephone Encounter (Signed)
Patient wanted to know if he can be worked in next week to get the scabs on scalp removed and some other health concerns. Only acute slots open.

## 2019-08-29 NOTE — Telephone Encounter (Signed)
Okay to schedule an acute but please let him know we will only be able to address a couple of issues.

## 2019-08-30 NOTE — Telephone Encounter (Signed)
Appointment has been made. No further questions at this time.  

## 2019-08-31 ENCOUNTER — Ambulatory Visit (INDEPENDENT_AMBULATORY_CARE_PROVIDER_SITE_OTHER): Payer: Medicare Other | Admitting: Family Medicine

## 2019-08-31 ENCOUNTER — Other Ambulatory Visit: Payer: Self-pay

## 2019-08-31 ENCOUNTER — Encounter: Payer: Self-pay | Admitting: Family Medicine

## 2019-08-31 VITALS — BP 128/79 | HR 84 | Ht 74.0 in | Wt 216.0 lb

## 2019-08-31 DIAGNOSIS — E876 Hypokalemia: Secondary | ICD-10-CM | POA: Diagnosis not present

## 2019-08-31 DIAGNOSIS — K529 Noninfective gastroenteritis and colitis, unspecified: Secondary | ICD-10-CM | POA: Diagnosis not present

## 2019-08-31 DIAGNOSIS — R109 Unspecified abdominal pain: Secondary | ICD-10-CM

## 2019-08-31 DIAGNOSIS — N3000 Acute cystitis without hematuria: Secondary | ICD-10-CM

## 2019-08-31 MED ORDER — DIPHENOXYLATE-ATROPINE 2.5-0.025 MG PO TABS: 1.0000 | ORAL_TABLET | Freq: Four times a day (QID) | ORAL | 0 refills | Status: DC | PRN

## 2019-08-31 NOTE — Assessment & Plan Note (Signed)
Ne GI referral placed. Still has a RF on the dicyclomine  RF the Lomotil.  Discussed that he really needs a scope for further work up.

## 2019-08-31 NOTE — Patient Instructions (Signed)
REcommend start low dose aspirin 81mg  daily.  Stop the medicine it starts to cause abdominal pain.
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
## 2019-09-01 ENCOUNTER — Other Ambulatory Visit: Payer: Self-pay

## 2019-09-01 LAB — BASIC METABOLIC PANEL WITH GFR
BUN: 8 mg/dL (ref 7–25)
CO2: 24 mmol/L (ref 20–32)
Calcium: 8.6 mg/dL (ref 8.6–10.3)
Chloride: 107 mmol/L (ref 98–110)
Creat: 0.78 mg/dL (ref 0.70–1.25)
GFR, Est African American: 107 mL/min/{1.73_m2} (ref >=60)
GFR, Est Non African American: 93 mL/min/{1.73_m2} (ref >=60)
Glucose, Bld: 85 mg/dL (ref 65–99)
Potassium: 3.8 mmol/L (ref 3.5–5.3)
Sodium: 141 mmol/L (ref 135–146)

## 2019-09-01 LAB — URINALYSIS, ROUTINE W REFLEX MICROSCOPIC
Bilirubin Urine: NEGATIVE
Glucose, UA: NEGATIVE
Hgb urine dipstick: NEGATIVE
Ketones, ur: NEGATIVE
Leukocytes,Ua: NEGATIVE
Nitrite: NEGATIVE
Protein, ur: NEGATIVE
Specific Gravity, Urine: 1.014 (ref 1.001–1.03)
pH: 6 (ref 5.0–8.0)

## 2019-09-01 NOTE — Telephone Encounter (Signed)
[name] states Dr Madilyn Fireman was going to send in [place]. Historical provider.

## 2019-09-02 MED ORDER — METHSCOPOLAMINE BROMIDE 5 MG PO TABS: 5.0000 mg | ORAL_TABLET | Freq: Two times a day (BID) | ORAL | 0 refills | Status: DC

## 2019-09-21 DIAGNOSIS — M79604 Pain in right leg: Secondary | ICD-10-CM | POA: Diagnosis not present

## 2019-09-21 DIAGNOSIS — G894 Chronic pain syndrome: Secondary | ICD-10-CM | POA: Diagnosis not present

## 2019-09-21 DIAGNOSIS — M961 Postlaminectomy syndrome, not elsewhere classified: Secondary | ICD-10-CM | POA: Diagnosis not present

## 2019-09-21 DIAGNOSIS — M549 Dorsalgia, unspecified: Secondary | ICD-10-CM | POA: Diagnosis not present

## 2019-10-04 ENCOUNTER — Encounter: Payer: Self-pay | Admitting: Family Medicine

## 2019-10-11 ENCOUNTER — Ambulatory Visit: Payer: Medicare Other | Admitting: Family Medicine

## 2019-11-23 DIAGNOSIS — M549 Dorsalgia, unspecified: Secondary | ICD-10-CM | POA: Diagnosis not present

## 2019-11-23 DIAGNOSIS — M5416 Radiculopathy, lumbar region: Secondary | ICD-10-CM | POA: Diagnosis not present

## 2019-11-23 DIAGNOSIS — G894 Chronic pain syndrome: Secondary | ICD-10-CM | POA: Diagnosis not present

## 2019-11-30 ENCOUNTER — Other Ambulatory Visit: Payer: Self-pay | Admitting: Family Medicine

## 2019-12-01 ENCOUNTER — Ambulatory Visit: Payer: Medicare Other | Admitting: Family Medicine

## 2019-12-08 ENCOUNTER — Other Ambulatory Visit: Payer: Self-pay

## 2019-12-08 ENCOUNTER — Encounter: Payer: Self-pay | Admitting: Family Medicine

## 2019-12-08 ENCOUNTER — Ambulatory Visit (INDEPENDENT_AMBULATORY_CARE_PROVIDER_SITE_OTHER): Payer: Medicare Other | Admitting: Family Medicine

## 2019-12-08 VITALS — BP 119/59 | HR 70 | Ht 74.0 in | Wt 218.0 lb

## 2019-12-08 DIAGNOSIS — R109 Unspecified abdominal pain: Secondary | ICD-10-CM

## 2019-12-08 DIAGNOSIS — I10 Essential (primary) hypertension: Secondary | ICD-10-CM | POA: Diagnosis not present

## 2019-12-08 DIAGNOSIS — F339 Major depressive disorder, recurrent, unspecified: Secondary | ICD-10-CM

## 2019-12-08 DIAGNOSIS — M47817 Spondylosis without myelopathy or radiculopathy, lumbosacral region: Secondary | ICD-10-CM | POA: Diagnosis not present

## 2019-12-08 DIAGNOSIS — K529 Noninfective gastroenteritis and colitis, unspecified: Secondary | ICD-10-CM | POA: Diagnosis not present

## 2019-12-08 DIAGNOSIS — K2100 Gastro-esophageal reflux disease with esophagitis, without bleeding: Secondary | ICD-10-CM | POA: Diagnosis not present

## 2019-12-08 DIAGNOSIS — Z23 Encounter for immunization: Secondary | ICD-10-CM

## 2019-12-08 DIAGNOSIS — M5416 Radiculopathy, lumbar region: Secondary | ICD-10-CM

## 2019-12-08 DIAGNOSIS — M25552 Pain in left hip: Secondary | ICD-10-CM | POA: Diagnosis not present

## 2019-12-08 DIAGNOSIS — L821 Other seborrheic keratosis: Secondary | ICD-10-CM

## 2019-12-08 MED ORDER — DIPHENOXYLATE-ATROPINE 2.5-0.025 MG PO TABS: 1.0000 | ORAL_TABLET | Freq: Four times a day (QID) | ORAL | 2 refills | Status: AC | PRN

## 2019-12-08 MED ORDER — METHSCOPOLAMINE BROMIDE 5 MG PO TABS: 5.0000 mg | ORAL_TABLET | Freq: Two times a day (BID) | ORAL | 1 refills | Status: DC

## 2019-12-08 MED ORDER — IPRATROPIUM BROMIDE 0.03 % NA SOLN: 2.0000 | Freq: Two times a day (BID) | NASAL | 12 refills | Status: AC

## 2019-12-08 MED ORDER — GABAPENTIN 600 MG PO TABS: 1200.0000 mg | ORAL_TABLET | Freq: Three times a day (TID) | ORAL | 5 refills | Status: DC

## 2019-12-08 MED ORDER — KETOROLAC TROMETHAMINE 60 MG/2ML IM SOLN
60.0000 mg | Freq: Once | INTRAMUSCULAR | Status: AC
  Administered 2019-12-08: 60 mg via INTRAMUSCULAR

## 2019-12-08 MED ORDER — DICYCLOMINE HCL 20 MG PO TABS: 20.0000 mg | ORAL_TABLET | Freq: Three times a day (TID) | ORAL | 5 refills | Status: DC

## 2019-12-08 NOTE — Assessment & Plan Note (Signed)
Well controlled. Continue current regimen. Follow up in  6 months.  
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
## 2019-12-08 NOTE — Assessment & Plan Note (Signed)
Per our records it looks like GI did try to contact him.  We will place a new GI referral today I discussed the importance of getting seen and treated for this condition to work him up further I will provide refills in the meantime.

## 2019-12-08 NOTE — Assessment & Plan Note (Signed)
He does feel like his been more down and depressed lately.  Has not been as motivated to get up and do things like paying his mortgage bill.  He is currently on fluoxetine but I do not manage his psychiatric care.  This is done through the [place].  Encouraged him to reach out to them.

## 2019-12-08 NOTE — Assessment & Plan Note (Signed)
He wants me to take over his gabapentin.  Our office has written for a couple of times he was previously getting it from the [place] he is on the max dose.  Did go ahead and refill medications today.

## 2020-01-24 DIAGNOSIS — G894 Chronic pain syndrome: Secondary | ICD-10-CM | POA: Diagnosis not present

## 2020-01-24 DIAGNOSIS — Z720 Tobacco use: Secondary | ICD-10-CM | POA: Diagnosis not present

## 2020-01-24 DIAGNOSIS — Z5181 Encounter for therapeutic drug level monitoring: Secondary | ICD-10-CM | POA: Diagnosis not present

## 2020-01-24 DIAGNOSIS — M961 Postlaminectomy syndrome, not elsewhere classified: Secondary | ICD-10-CM | POA: Diagnosis not present

## 2020-01-24 DIAGNOSIS — Z79899 Other long term (current) drug therapy: Secondary | ICD-10-CM | POA: Diagnosis not present

## 2020-01-24 DIAGNOSIS — M549 Dorsalgia, unspecified: Secondary | ICD-10-CM | POA: Diagnosis not present

## 2020-01-24 DIAGNOSIS — M5442 Lumbago with sciatica, left side: Secondary | ICD-10-CM | POA: Diagnosis not present

## 2020-01-24 DIAGNOSIS — M5417 Radiculopathy, lumbosacral region: Secondary | ICD-10-CM | POA: Diagnosis not present

## 2020-03-26 DIAGNOSIS — M549 Dorsalgia, unspecified: Secondary | ICD-10-CM | POA: Diagnosis not present

## 2020-03-26 DIAGNOSIS — M5416 Radiculopathy, lumbar region: Secondary | ICD-10-CM | POA: Diagnosis not present

## 2020-03-26 DIAGNOSIS — F3289 Other specified depressive episodes: Secondary | ICD-10-CM | POA: Diagnosis not present

## 2020-03-26 DIAGNOSIS — M545 Low back pain, unspecified: Secondary | ICD-10-CM | POA: Diagnosis not present

## 2020-03-26 DIAGNOSIS — G894 Chronic pain syndrome: Secondary | ICD-10-CM | POA: Diagnosis not present

## 2020-03-26 DIAGNOSIS — M47816 Spondylosis without myelopathy or radiculopathy, lumbar region: Secondary | ICD-10-CM | POA: Diagnosis not present

## 2020-03-26 DIAGNOSIS — M5136 Other intervertebral disc degeneration, lumbar region: Secondary | ICD-10-CM | POA: Diagnosis not present

## 2020-03-26 DIAGNOSIS — M79604 Pain in right leg: Secondary | ICD-10-CM | POA: Diagnosis not present

## 2020-03-26 DIAGNOSIS — M5417 Radiculopathy, lumbosacral region: Secondary | ICD-10-CM | POA: Diagnosis not present

## 2020-03-26 DIAGNOSIS — M5418 Radiculopathy, sacral and sacrococcygeal region: Secondary | ICD-10-CM | POA: Diagnosis not present

## 2020-03-26 DIAGNOSIS — M5442 Lumbago with sciatica, left side: Secondary | ICD-10-CM | POA: Diagnosis not present

## 2020-03-26 DIAGNOSIS — M961 Postlaminectomy syndrome, not elsewhere classified: Secondary | ICD-10-CM | POA: Diagnosis not present

## 2020-04-10 ENCOUNTER — Ambulatory Visit: Payer: Medicare Other | Admitting: Family Medicine

## 2020-04-17 ENCOUNTER — Other Ambulatory Visit: Payer: Self-pay

## 2020-04-17 ENCOUNTER — Ambulatory Visit (INDEPENDENT_AMBULATORY_CARE_PROVIDER_SITE_OTHER): Payer: Medicare Other | Admitting: Family Medicine

## 2020-04-17 ENCOUNTER — Encounter: Payer: Self-pay | Admitting: Family Medicine

## 2020-04-17 VITALS — BP 133/78 | HR 78

## 2020-04-17 DIAGNOSIS — I10 Essential (primary) hypertension: Secondary | ICD-10-CM

## 2020-04-17 DIAGNOSIS — G8929 Other chronic pain: Secondary | ICD-10-CM | POA: Diagnosis not present

## 2020-04-17 DIAGNOSIS — L720 Epidermal cyst: Secondary | ICD-10-CM

## 2020-04-17 DIAGNOSIS — A09 Infectious gastroenteritis and colitis, unspecified: Secondary | ICD-10-CM | POA: Diagnosis not present

## 2020-04-17 DIAGNOSIS — R52 Pain, unspecified: Secondary | ICD-10-CM

## 2020-04-17 DIAGNOSIS — R109 Unspecified abdominal pain: Secondary | ICD-10-CM

## 2020-04-17 DIAGNOSIS — M961 Postlaminectomy syndrome, not elsewhere classified: Secondary | ICD-10-CM

## 2020-04-17 DIAGNOSIS — K529 Noninfective gastroenteritis and colitis, unspecified: Secondary | ICD-10-CM | POA: Diagnosis not present

## 2020-04-17 MED ORDER — KETOROLAC TROMETHAMINE 60 MG/2ML IM SOLN
60.0000 mg | Freq: Once | INTRAMUSCULAR | Status: AC
  Administered 2020-04-17: 60 mg via INTRAMUSCULAR

## 2020-04-17 NOTE — Assessment & Plan Note (Signed)
Given phone number for [facility], new referral placed.  He had no showed to previous referral.

## 2020-04-17 NOTE — Patient Instructions (Addendum)
You have refills for the gabapentin available at [facility] if needed.    The phone number for [facility] [phone].  We are sending an official referral but I would call them to schedule.
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
## 2020-04-17 NOTE — Assessment & Plan Note (Signed)
Well controlled. Continue current regimen. Follow up in  4 months.   

## 2020-04-25 DIAGNOSIS — K219 Gastro-esophageal reflux disease without esophagitis: Secondary | ICD-10-CM | POA: Diagnosis not present

## 2020-04-25 DIAGNOSIS — R197 Diarrhea, unspecified: Secondary | ICD-10-CM | POA: Diagnosis not present

## 2020-04-25 DIAGNOSIS — R1033 Periumbilical pain: Secondary | ICD-10-CM | POA: Diagnosis not present

## 2020-04-25 DIAGNOSIS — Z9049 Acquired absence of other specified parts of digestive tract: Secondary | ICD-10-CM | POA: Diagnosis not present

## 2020-04-30 ENCOUNTER — Telehealth: Payer: Self-pay | Admitting: Family Medicine

## 2020-04-30 NOTE — Telephone Encounter (Signed)
OK, please notify patient of what they said.

## 2020-04-30 NOTE — Telephone Encounter (Signed)
Inez Catalina from [facility] called and stated they had to decline the referral for John Scott due to he was referred before and he cancelled or no showed his appointments. - CF

## 2020-05-01 NOTE — Telephone Encounter (Signed)
I called patient and left a voicemail letting him know they declined the referral and if he had another one in mind we could send the referral to them but for now he would need to stay with his current pain doctor. - CF

## 2020-05-24 DIAGNOSIS — M5442 Lumbago with sciatica, left side: Secondary | ICD-10-CM | POA: Diagnosis not present

## 2020-05-24 DIAGNOSIS — G894 Chronic pain syndrome: Secondary | ICD-10-CM | POA: Diagnosis not present

## 2020-05-24 DIAGNOSIS — M5416 Radiculopathy, lumbar region: Secondary | ICD-10-CM | POA: Diagnosis not present

## 2020-05-24 DIAGNOSIS — M549 Dorsalgia, unspecified: Secondary | ICD-10-CM | POA: Diagnosis not present

## 2020-05-24 DIAGNOSIS — G8929 Other chronic pain: Secondary | ICD-10-CM | POA: Diagnosis not present

## 2020-05-24 DIAGNOSIS — M961 Postlaminectomy syndrome, not elsewhere classified: Secondary | ICD-10-CM | POA: Diagnosis not present

## 2020-05-24 DIAGNOSIS — M5136 Other intervertebral disc degeneration, lumbar region: Secondary | ICD-10-CM | POA: Diagnosis not present

## 2020-05-24 DIAGNOSIS — M5417 Radiculopathy, lumbosacral region: Secondary | ICD-10-CM | POA: Diagnosis not present

## 2020-05-24 DIAGNOSIS — M47816 Spondylosis without myelopathy or radiculopathy, lumbar region: Secondary | ICD-10-CM | POA: Diagnosis not present

## 2020-05-27 DIAGNOSIS — Z20822 Contact with and (suspected) exposure to covid-19: Secondary | ICD-10-CM | POA: Diagnosis not present

## 2020-05-27 DIAGNOSIS — Z01812 Encounter for preprocedural laboratory examination: Secondary | ICD-10-CM | POA: Diagnosis not present

## 2020-07-18 DIAGNOSIS — I158 Other secondary hypertension: Secondary | ICD-10-CM | POA: Diagnosis not present

## 2020-07-18 DIAGNOSIS — G9340 Encephalopathy, unspecified: Secondary | ICD-10-CM | POA: Diagnosis not present

## 2020-07-18 DIAGNOSIS — K219 Gastro-esophageal reflux disease without esophagitis: Secondary | ICD-10-CM | POA: Diagnosis not present

## 2020-07-18 DIAGNOSIS — K2289 Other specified disease of esophagus: Secondary | ICD-10-CM | POA: Diagnosis not present

## 2020-07-18 DIAGNOSIS — I10 Essential (primary) hypertension: Secondary | ICD-10-CM | POA: Diagnosis not present

## 2020-07-18 DIAGNOSIS — R112 Nausea with vomiting, unspecified: Secondary | ICD-10-CM | POA: Diagnosis not present

## 2020-07-18 DIAGNOSIS — F17210 Nicotine dependence, cigarettes, uncomplicated: Secondary | ICD-10-CM | POA: Diagnosis not present

## 2020-07-18 DIAGNOSIS — G9009 Other idiopathic peripheral autonomic neuropathy: Secondary | ICD-10-CM | POA: Diagnosis not present

## 2020-07-18 DIAGNOSIS — M5417 Radiculopathy, lumbosacral region: Secondary | ICD-10-CM | POA: Diagnosis not present

## 2020-07-18 DIAGNOSIS — M5418 Radiculopathy, sacral and sacrococcygeal region: Secondary | ICD-10-CM | POA: Diagnosis not present

## 2020-07-18 DIAGNOSIS — M5136 Other intervertebral disc degeneration, lumbar region: Secondary | ICD-10-CM | POA: Diagnosis not present

## 2020-07-18 DIAGNOSIS — M47818 Spondylosis without myelopathy or radiculopathy, sacral and sacrococcygeal region: Secondary | ICD-10-CM | POA: Diagnosis not present

## 2020-07-18 DIAGNOSIS — K644 Residual hemorrhoidal skin tags: Secondary | ICD-10-CM | POA: Diagnosis not present

## 2020-07-18 DIAGNOSIS — Z79899 Other long term (current) drug therapy: Secondary | ICD-10-CM | POA: Diagnosis not present

## 2020-07-18 DIAGNOSIS — E785 Hyperlipidemia, unspecified: Secondary | ICD-10-CM | POA: Diagnosis not present

## 2020-07-18 DIAGNOSIS — D122 Benign neoplasm of ascending colon: Secondary | ICD-10-CM | POA: Diagnosis not present

## 2020-07-18 DIAGNOSIS — M5416 Radiculopathy, lumbar region: Secondary | ICD-10-CM | POA: Diagnosis not present

## 2020-07-18 DIAGNOSIS — Z791 Long term (current) use of non-steroidal anti-inflammatories (NSAID): Secondary | ICD-10-CM | POA: Diagnosis not present

## 2020-07-18 DIAGNOSIS — M1990 Unspecified osteoarthritis, unspecified site: Secondary | ICD-10-CM | POA: Diagnosis not present

## 2020-07-18 DIAGNOSIS — F32A Depression, unspecified: Secondary | ICD-10-CM | POA: Diagnosis not present

## 2020-07-18 DIAGNOSIS — R197 Diarrhea, unspecified: Secondary | ICD-10-CM | POA: Diagnosis not present

## 2020-07-19 DIAGNOSIS — K644 Residual hemorrhoidal skin tags: Secondary | ICD-10-CM | POA: Diagnosis not present

## 2020-07-19 DIAGNOSIS — R112 Nausea with vomiting, unspecified: Secondary | ICD-10-CM | POA: Diagnosis not present

## 2020-07-19 DIAGNOSIS — R197 Diarrhea, unspecified: Secondary | ICD-10-CM | POA: Diagnosis not present

## 2020-07-19 DIAGNOSIS — I10 Essential (primary) hypertension: Secondary | ICD-10-CM | POA: Diagnosis not present

## 2020-07-19 DIAGNOSIS — D122 Benign neoplasm of ascending colon: Secondary | ICD-10-CM | POA: Diagnosis not present

## 2020-07-19 DIAGNOSIS — K2289 Other specified disease of esophagus: Secondary | ICD-10-CM | POA: Diagnosis not present

## 2020-07-19 DIAGNOSIS — I158 Other secondary hypertension: Secondary | ICD-10-CM | POA: Diagnosis not present

## 2020-07-20 DIAGNOSIS — I158 Other secondary hypertension: Secondary | ICD-10-CM | POA: Diagnosis not present

## 2020-07-20 DIAGNOSIS — R194 Change in bowel habit: Secondary | ICD-10-CM | POA: Diagnosis not present

## 2020-07-20 DIAGNOSIS — I10 Essential (primary) hypertension: Secondary | ICD-10-CM | POA: Diagnosis not present

## 2020-07-20 DIAGNOSIS — K635 Polyp of colon: Secondary | ICD-10-CM | POA: Diagnosis not present

## 2020-07-20 DIAGNOSIS — K219 Gastro-esophageal reflux disease without esophagitis: Secondary | ICD-10-CM | POA: Diagnosis not present

## 2020-07-20 DIAGNOSIS — R1010 Upper abdominal pain, unspecified: Secondary | ICD-10-CM | POA: Diagnosis not present

## 2020-07-20 DIAGNOSIS — D122 Benign neoplasm of ascending colon: Secondary | ICD-10-CM | POA: Diagnosis not present

## 2020-07-20 DIAGNOSIS — K648 Other hemorrhoids: Secondary | ICD-10-CM | POA: Diagnosis not present

## 2020-07-20 DIAGNOSIS — K644 Residual hemorrhoidal skin tags: Secondary | ICD-10-CM | POA: Diagnosis not present

## 2020-07-20 DIAGNOSIS — F17200 Nicotine dependence, unspecified, uncomplicated: Secondary | ICD-10-CM | POA: Diagnosis not present

## 2020-07-20 DIAGNOSIS — K2289 Other specified disease of esophagus: Secondary | ICD-10-CM | POA: Diagnosis not present

## 2020-07-20 DIAGNOSIS — R197 Diarrhea, unspecified: Secondary | ICD-10-CM | POA: Diagnosis not present

## 2020-07-20 DIAGNOSIS — R112 Nausea with vomiting, unspecified: Secondary | ICD-10-CM | POA: Diagnosis not present

## 2020-07-20 DIAGNOSIS — E785 Hyperlipidemia, unspecified: Secondary | ICD-10-CM | POA: Diagnosis not present

## 2020-07-20 DIAGNOSIS — D126 Benign neoplasm of colon, unspecified: Secondary | ICD-10-CM | POA: Diagnosis not present

## 2020-07-20 LAB — HM COLONOSCOPY

## 2020-07-22 DIAGNOSIS — N401 Enlarged prostate with lower urinary tract symptoms: Secondary | ICD-10-CM | POA: Diagnosis not present

## 2020-07-22 DIAGNOSIS — M961 Postlaminectomy syndrome, not elsewhere classified: Secondary | ICD-10-CM | POA: Diagnosis not present

## 2020-07-22 DIAGNOSIS — Z791 Long term (current) use of non-steroidal anti-inflammatories (NSAID): Secondary | ICD-10-CM | POA: Diagnosis not present

## 2020-07-22 DIAGNOSIS — M47817 Spondylosis without myelopathy or radiculopathy, lumbosacral region: Secondary | ICD-10-CM | POA: Diagnosis not present

## 2020-07-22 DIAGNOSIS — M48061 Spinal stenosis, lumbar region without neurogenic claudication: Secondary | ICD-10-CM | POA: Diagnosis not present

## 2020-07-22 DIAGNOSIS — F17290 Nicotine dependence, other tobacco product, uncomplicated: Secondary | ICD-10-CM | POA: Diagnosis not present

## 2020-07-22 DIAGNOSIS — J310 Chronic rhinitis: Secondary | ICD-10-CM | POA: Diagnosis not present

## 2020-07-22 DIAGNOSIS — E785 Hyperlipidemia, unspecified: Secondary | ICD-10-CM | POA: Diagnosis not present

## 2020-07-22 DIAGNOSIS — M7918 Myalgia, other site: Secondary | ICD-10-CM | POA: Diagnosis not present

## 2020-07-22 DIAGNOSIS — Z79899 Other long term (current) drug therapy: Secondary | ICD-10-CM | POA: Diagnosis not present

## 2020-07-22 DIAGNOSIS — G894 Chronic pain syndrome: Secondary | ICD-10-CM | POA: Diagnosis not present

## 2020-07-22 DIAGNOSIS — N138 Other obstructive and reflux uropathy: Secondary | ICD-10-CM | POA: Diagnosis not present

## 2020-07-22 DIAGNOSIS — M5116 Intervertebral disc disorders with radiculopathy, lumbar region: Secondary | ICD-10-CM | POA: Diagnosis not present

## 2020-07-22 DIAGNOSIS — I10 Essential (primary) hypertension: Secondary | ICD-10-CM | POA: Diagnosis not present

## 2020-07-22 DIAGNOSIS — R110 Nausea: Secondary | ICD-10-CM | POA: Diagnosis not present

## 2020-07-22 DIAGNOSIS — G609 Hereditary and idiopathic neuropathy, unspecified: Secondary | ICD-10-CM | POA: Diagnosis not present

## 2020-07-22 DIAGNOSIS — Z79891 Long term (current) use of opiate analgesic: Secondary | ICD-10-CM | POA: Diagnosis not present

## 2020-07-22 DIAGNOSIS — K591 Functional diarrhea: Secondary | ICD-10-CM | POA: Diagnosis not present

## 2020-07-22 DIAGNOSIS — F32A Depression, unspecified: Secondary | ICD-10-CM | POA: Diagnosis not present

## 2020-07-22 DIAGNOSIS — M1990 Unspecified osteoarthritis, unspecified site: Secondary | ICD-10-CM | POA: Diagnosis not present

## 2020-07-22 DIAGNOSIS — S34109D Unspecified injury to unspecified level of lumbar spinal cord, subsequent encounter: Secondary | ICD-10-CM | POA: Diagnosis not present

## 2020-07-22 DIAGNOSIS — K219 Gastro-esophageal reflux disease without esophagitis: Secondary | ICD-10-CM | POA: Diagnosis not present

## 2020-08-02 ENCOUNTER — Encounter: Payer: Self-pay | Admitting: Family Medicine

## 2020-08-02 DIAGNOSIS — Z9049 Acquired absence of other specified parts of digestive tract: Secondary | ICD-10-CM | POA: Diagnosis not present

## 2020-08-02 DIAGNOSIS — R197 Diarrhea, unspecified: Secondary | ICD-10-CM | POA: Diagnosis not present

## 2020-08-02 DIAGNOSIS — K219 Gastro-esophageal reflux disease without esophagitis: Secondary | ICD-10-CM | POA: Diagnosis not present

## 2020-08-02 DIAGNOSIS — Z86010 Personal history of colonic polyps: Secondary | ICD-10-CM | POA: Diagnosis not present

## 2020-08-07 ENCOUNTER — Telehealth: Payer: Self-pay

## 2020-08-07 NOTE — Telephone Encounter (Signed)
[facility] confirmed they made a mistake calling us and will contact GI for the order. No other concerns.

## 2020-08-07 NOTE — Telephone Encounter (Signed)
Please verify with her who actually ordered the [facility].  As far as I can tell he has not been hospitalized recently and I did not order home health.  So whoever ordered it it really needs to be the one to sign off on it it looks like he has been seeing his GI doctor pretty frequently over the last several months so maybe it was them?  Not sure?  But I need a little bit more detail if we can get that.

## 2020-08-07 NOTE — Telephone Encounter (Signed)
Samantha from [facility] called and asked for a verbal order. PT 2 weeks 4, 1 week 4, nursing and OT evaluation.   Callback -[id number] [id number]

## 2020-08-09 DIAGNOSIS — M5416 Radiculopathy, lumbar region: Secondary | ICD-10-CM | POA: Diagnosis not present

## 2020-08-09 DIAGNOSIS — M5417 Radiculopathy, lumbosacral region: Secondary | ICD-10-CM | POA: Diagnosis not present

## 2020-08-09 DIAGNOSIS — G894 Chronic pain syndrome: Secondary | ICD-10-CM | POA: Diagnosis not present

## 2020-08-09 DIAGNOSIS — M549 Dorsalgia, unspecified: Secondary | ICD-10-CM | POA: Diagnosis not present

## 2020-08-09 DIAGNOSIS — Z79899 Other long term (current) drug therapy: Secondary | ICD-10-CM | POA: Diagnosis not present

## 2020-08-09 DIAGNOSIS — M5136 Other intervertebral disc degeneration, lumbar region: Secondary | ICD-10-CM | POA: Diagnosis not present

## 2020-08-09 DIAGNOSIS — M5442 Lumbago with sciatica, left side: Secondary | ICD-10-CM | POA: Diagnosis not present

## 2020-08-09 DIAGNOSIS — F3289 Other specified depressive episodes: Secondary | ICD-10-CM | POA: Diagnosis not present

## 2020-08-09 DIAGNOSIS — G8929 Other chronic pain: Secondary | ICD-10-CM | POA: Diagnosis not present

## 2020-08-09 DIAGNOSIS — M961 Postlaminectomy syndrome, not elsewhere classified: Secondary | ICD-10-CM | POA: Diagnosis not present

## 2020-08-09 DIAGNOSIS — Z5181 Encounter for therapeutic drug level monitoring: Secondary | ICD-10-CM | POA: Diagnosis not present

## 2020-08-09 DIAGNOSIS — M47816 Spondylosis without myelopathy or radiculopathy, lumbar region: Secondary | ICD-10-CM | POA: Diagnosis not present

## 2020-08-16 ENCOUNTER — Ambulatory Visit: Payer: Medicare Other | Admitting: Family Medicine

## 2020-08-16 ENCOUNTER — Other Ambulatory Visit: Payer: Self-pay | Admitting: Family Medicine

## 2020-08-21 DIAGNOSIS — G609 Hereditary and idiopathic neuropathy, unspecified: Secondary | ICD-10-CM | POA: Diagnosis not present

## 2020-08-21 DIAGNOSIS — G894 Chronic pain syndrome: Secondary | ICD-10-CM | POA: Diagnosis not present

## 2020-08-21 DIAGNOSIS — S34109D Unspecified injury to unspecified level of lumbar spinal cord, subsequent encounter: Secondary | ICD-10-CM | POA: Diagnosis not present

## 2020-08-21 DIAGNOSIS — K219 Gastro-esophageal reflux disease without esophagitis: Secondary | ICD-10-CM | POA: Diagnosis not present

## 2020-08-21 DIAGNOSIS — I10 Essential (primary) hypertension: Secondary | ICD-10-CM | POA: Diagnosis not present

## 2020-08-21 DIAGNOSIS — K591 Functional diarrhea: Secondary | ICD-10-CM | POA: Diagnosis not present

## 2020-08-21 DIAGNOSIS — F32A Depression, unspecified: Secondary | ICD-10-CM | POA: Diagnosis not present

## 2020-08-21 DIAGNOSIS — M1990 Unspecified osteoarthritis, unspecified site: Secondary | ICD-10-CM | POA: Diagnosis not present

## 2020-08-21 DIAGNOSIS — Z79891 Long term (current) use of opiate analgesic: Secondary | ICD-10-CM | POA: Diagnosis not present

## 2020-08-21 DIAGNOSIS — M5116 Intervertebral disc disorders with radiculopathy, lumbar region: Secondary | ICD-10-CM | POA: Diagnosis not present

## 2020-08-21 DIAGNOSIS — Z79899 Other long term (current) drug therapy: Secondary | ICD-10-CM | POA: Diagnosis not present

## 2020-08-21 DIAGNOSIS — Z791 Long term (current) use of non-steroidal anti-inflammatories (NSAID): Secondary | ICD-10-CM | POA: Diagnosis not present

## 2020-08-21 DIAGNOSIS — M47817 Spondylosis without myelopathy or radiculopathy, lumbosacral region: Secondary | ICD-10-CM | POA: Diagnosis not present

## 2020-08-21 DIAGNOSIS — M7918 Myalgia, other site: Secondary | ICD-10-CM | POA: Diagnosis not present

## 2020-08-21 DIAGNOSIS — J310 Chronic rhinitis: Secondary | ICD-10-CM | POA: Diagnosis not present

## 2020-08-21 DIAGNOSIS — M961 Postlaminectomy syndrome, not elsewhere classified: Secondary | ICD-10-CM | POA: Diagnosis not present

## 2020-08-21 DIAGNOSIS — M48061 Spinal stenosis, lumbar region without neurogenic claudication: Secondary | ICD-10-CM | POA: Diagnosis not present

## 2020-08-21 DIAGNOSIS — F17290 Nicotine dependence, other tobacco product, uncomplicated: Secondary | ICD-10-CM | POA: Diagnosis not present

## 2020-08-21 DIAGNOSIS — N138 Other obstructive and reflux uropathy: Secondary | ICD-10-CM | POA: Diagnosis not present

## 2020-08-21 DIAGNOSIS — E785 Hyperlipidemia, unspecified: Secondary | ICD-10-CM | POA: Diagnosis not present

## 2020-08-21 DIAGNOSIS — R110 Nausea: Secondary | ICD-10-CM | POA: Diagnosis not present

## 2020-08-21 DIAGNOSIS — N401 Enlarged prostate with lower urinary tract symptoms: Secondary | ICD-10-CM | POA: Diagnosis not present

## 2020-08-27 DIAGNOSIS — M48061 Spinal stenosis, lumbar region without neurogenic claudication: Secondary | ICD-10-CM | POA: Diagnosis not present

## 2020-08-27 DIAGNOSIS — M961 Postlaminectomy syndrome, not elsewhere classified: Secondary | ICD-10-CM | POA: Diagnosis not present

## 2020-08-27 DIAGNOSIS — M47817 Spondylosis without myelopathy or radiculopathy, lumbosacral region: Secondary | ICD-10-CM | POA: Diagnosis not present

## 2020-08-27 DIAGNOSIS — G894 Chronic pain syndrome: Secondary | ICD-10-CM | POA: Diagnosis not present

## 2020-08-27 DIAGNOSIS — M5116 Intervertebral disc disorders with radiculopathy, lumbar region: Secondary | ICD-10-CM | POA: Diagnosis not present

## 2020-08-27 DIAGNOSIS — S34109D Unspecified injury to unspecified level of lumbar spinal cord, subsequent encounter: Secondary | ICD-10-CM | POA: Diagnosis not present

## 2020-09-10 DIAGNOSIS — S34109D Unspecified injury to unspecified level of lumbar spinal cord, subsequent encounter: Secondary | ICD-10-CM | POA: Diagnosis not present

## 2020-09-10 DIAGNOSIS — M48061 Spinal stenosis, lumbar region without neurogenic claudication: Secondary | ICD-10-CM | POA: Diagnosis not present

## 2020-09-10 DIAGNOSIS — M5116 Intervertebral disc disorders with radiculopathy, lumbar region: Secondary | ICD-10-CM | POA: Diagnosis not present

## 2020-09-10 DIAGNOSIS — M47817 Spondylosis without myelopathy or radiculopathy, lumbosacral region: Secondary | ICD-10-CM | POA: Diagnosis not present

## 2020-09-10 DIAGNOSIS — G894 Chronic pain syndrome: Secondary | ICD-10-CM | POA: Diagnosis not present

## 2020-09-10 DIAGNOSIS — M961 Postlaminectomy syndrome, not elsewhere classified: Secondary | ICD-10-CM | POA: Diagnosis not present

## 2020-09-18 ENCOUNTER — Telehealth: Payer: Self-pay | Admitting: Family Medicine

## 2020-09-18 DIAGNOSIS — M5416 Radiculopathy, lumbar region: Secondary | ICD-10-CM | POA: Diagnosis not present

## 2020-09-18 DIAGNOSIS — F419 Anxiety disorder, unspecified: Secondary | ICD-10-CM | POA: Diagnosis not present

## 2020-09-18 NOTE — Chronic Care Management (AMB) (Signed)
  Chronic Care Management   Note  09/18/2020 Name: AGAM DAVENPORT MRN: [id number] DOB: 1950-05-12  ZADIEL LEYH is a 70 y.o. year old male who is a primary care patient of Metheney, Rene Kocher, MD. I reached out to [name] by phone today in response to a referral sent by Mr. Beatris Ship Soules's PCP, Hali Marry, MD.   Mr. Runyon was given information about Chronic Care Management services today including:  CCM service includes personalized support from designated clinical staff supervised by his physician, including individualized plan of care and coordination with other care providers 24/7 contact phone numbers for assistance for urgent and routine care needs. Service will only be billed when office clinical staff spend 20 minutes or more in a month to coordinate care. Only one practitioner may furnish and bill the service in a calendar month. The patient may stop CCM services at any time (effective at the end of the month) by phone call to the office staff.   Patient agreed to services and verbal consent obtained.   Follow up plan:   Lauretta Grill [facility] Scheduler

## 2020-10-02 ENCOUNTER — Ambulatory Visit (INDEPENDENT_AMBULATORY_CARE_PROVIDER_SITE_OTHER): Payer: Medicare Other | Admitting: Family Medicine

## 2020-10-02 ENCOUNTER — Other Ambulatory Visit: Payer: Self-pay

## 2020-10-02 VITALS — BP 153/92 | HR 76 | Ht 74.0 in | Wt 235.1 lb

## 2020-10-02 DIAGNOSIS — F17200 Nicotine dependence, unspecified, uncomplicated: Secondary | ICD-10-CM | POA: Diagnosis not present

## 2020-10-02 DIAGNOSIS — Z0000 Encounter for general adult medical examination without abnormal findings: Secondary | ICD-10-CM | POA: Diagnosis not present

## 2020-10-02 DIAGNOSIS — Z122 Encounter for screening for malignant neoplasm of respiratory organs: Secondary | ICD-10-CM
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
## 2020-10-02 NOTE — Patient Instructions (Addendum)
[place] Maintenance Summary and Written Plan of Care  John Scott ,  Thank you for allowing me to perform your Medicare Annual Wellness Visit and for your ongoing commitment to your health.   Health Maintenance & Immunization History Health Maintenance  Topic Date Due   COVID-19 Vaccine (3 - Moderna risk series) 10/18/2020 (Originally 06/21/2020)   Hepatitis C Screening  10/02/2021 (Originally 12/14/1968)   INFLUENZA VACCINE  10/15/2020   COLONOSCOPY (Pts 45-36yrs Insurance coverage will need to be confirmed)  07/21/2023   TETANUS/TDAP  02/02/2028   PNA vac Low Risk Adult  Completed   Zoster Vaccines- Shingrix  Completed   HPV VACCINES  Aged Out   Immunization History  Administered Date(s) Administered   Fluad Quad(high Dose 65+) 11/02/2018, 12/08/2019   H1N1 02/24/2008   Influenza Split 11/12/2011   Influenza Whole 12/16/2007, 12/08/2008, 11/11/2012   Influenza, High Dose Seasonal PF 01/15/2017, 02/01/2018   Influenza-Unspecified 12/16/2014   Moderna SARS-COV2 Booster Vaccination 05/24/2020   Moderna Sars-Covid-2 Vaccination 03/14/2020   Pneumococcal Conjugate-13 05/13/2016   Pneumococcal Polysaccharide-23 12/16/2007, 05/03/2018   Td 09/14/2003, 01/14/2004, 12/16/2007   Tdap 02/01/2018   Tetanus 08/16/1994   Zoster Recombinat (Shingrix) 10/02/2017, 02/01/2018    These are the patient goals that we discussed:  Goals Addressed               This Visit's Progress     Patient Stated (pt-stated)        10/02/2020 AWV Goal: Exercise for General Health  Patient will verbalize understanding of the benefits of increased physical activity: Exercising regularly is important. It will improve your overall fitness, flexibility, and endurance. Regular exercise also will improve your overall health. It can help you control your weight, reduce stress, and improve your bone density. Over the next year, patient will increase physical activity as  tolerated with a goal of at least 150 minutes of moderate physical activity per week.  You can tell that you are exercising at a moderate intensity if your heart starts beating faster and you start breathing faster but can still hold a conversation. Moderate-intensity exercise ideas include: Walking 1 mile (1.6 km) in about 15 minutes Biking Hiking Golfing Dancing Water aerobics Patient will verbalize understanding of everyday activities that increase physical activity by providing examples like the following: Yard work, such as: Sales promotion account executive Gardening Washing windows or floors Patient will be able to explain general safety guidelines for exercising:  Before you start a new exercise program, talk with your health care provider. Do not exercise so much that you hurt yourself, feel dizzy, or get very short of breath. Wear comfortable clothes and wear shoes with good support. Drink plenty of water while you exercise to prevent dehydration or heat stroke. Work out until your breathing and your heartbeat get faster.          This is a list of Health Maintenance Items that are overdue or due now: Hep C screening    Orders/Referrals Placed Today: Orders Placed This Encounter  Procedures   Ambulatory Referral for Lung Cancer Scre    Referral Priority:   Routine    Referral Type:   Consultation    Referral Reason:   Specialty Services Required    Number of Visits Requested:   1   (Contact our referral department at [phone] if you have not spoken with someone about your referral appointment within  the next 5 days)    Follow-up Plan Follow-up with Hali Marry, MD as planned Referral for your lung cancer screening has been sent and they will call you to schedule.  Medicare wellness visit in one year. AVS printed and given to the patient.

## 2020-10-04 DIAGNOSIS — G8929 Other chronic pain: Secondary | ICD-10-CM | POA: Diagnosis not present

## 2020-10-04 DIAGNOSIS — M961 Postlaminectomy syndrome, not elsewhere classified: Secondary | ICD-10-CM | POA: Diagnosis not present

## 2020-10-04 DIAGNOSIS — M47816 Spondylosis without myelopathy or radiculopathy, lumbar region: Secondary | ICD-10-CM | POA: Diagnosis not present

## 2020-10-04 DIAGNOSIS — M549 Dorsalgia, unspecified: Secondary | ICD-10-CM | POA: Diagnosis not present

## 2020-10-04 DIAGNOSIS — F3289 Other specified depressive episodes: Secondary | ICD-10-CM | POA: Diagnosis not present

## 2020-10-04 DIAGNOSIS — M5416 Radiculopathy, lumbar region: Secondary | ICD-10-CM | POA: Diagnosis not present

## 2020-10-04 DIAGNOSIS — M5442 Lumbago with sciatica, left side: Secondary | ICD-10-CM | POA: Diagnosis not present

## 2020-10-04 DIAGNOSIS — M5136 Other intervertebral disc degeneration, lumbar region: Secondary | ICD-10-CM | POA: Diagnosis not present

## 2020-10-04 DIAGNOSIS — M5417 Radiculopathy, lumbosacral region: Secondary | ICD-10-CM | POA: Diagnosis not present

## 2020-10-04 DIAGNOSIS — G894 Chronic pain syndrome: Secondary | ICD-10-CM | POA: Diagnosis not present

## 2020-10-12 DIAGNOSIS — M549 Dorsalgia, unspecified: Secondary | ICD-10-CM | POA: Diagnosis not present

## 2020-10-12 DIAGNOSIS — M5416 Radiculopathy, lumbar region: Secondary | ICD-10-CM | POA: Diagnosis not present

## 2020-10-12 DIAGNOSIS — G894 Chronic pain syndrome: Secondary | ICD-10-CM | POA: Diagnosis not present

## 2020-10-12 DIAGNOSIS — G8929 Other chronic pain: Secondary | ICD-10-CM | POA: Diagnosis not present

## 2020-10-12 DIAGNOSIS — M47816 Spondylosis without myelopathy or radiculopathy, lumbar region: Secondary | ICD-10-CM | POA: Diagnosis not present

## 2020-10-12 DIAGNOSIS — M5136 Other intervertebral disc degeneration, lumbar region: Secondary | ICD-10-CM | POA: Diagnosis not present

## 2020-10-12 DIAGNOSIS — M5442 Lumbago with sciatica, left side: Secondary | ICD-10-CM | POA: Diagnosis not present

## 2020-10-12 DIAGNOSIS — M961 Postlaminectomy syndrome, not elsewhere classified: Secondary | ICD-10-CM | POA: Diagnosis not present

## 2020-10-12 DIAGNOSIS — M5417 Radiculopathy, lumbosacral region: Secondary | ICD-10-CM | POA: Diagnosis not present

## 2020-10-16 ENCOUNTER — Telehealth: Payer: Self-pay | Admitting: Pharmacist

## 2020-10-16 NOTE — Chronic Care Management (AMB) (Signed)
Chronic Care Management Pharmacy Assistant   Name: John Scott  MRN: [id number] DOB: 07/29/1950  John Scott is an 70 y.o. year old male who presents for his initial CCM visit with the clinical pharmacist.  Reason for Encounter: Initial CCM Visit  Recent office visits:  No visits noted  Recent consult visits:  10/04/20- Lind Covert, NP (Pain Medicine)- seen for follow up of back pain, increased norco 10/325 mg to four times daily prn, discontinued nucynta er due to cost, follow up 8 weeks  09/18/20- Left S1 Transforaminal Epidural Steroid Injection for back/leg pain by Wylene Men, MD at pain medicine  08/09/20-  Lind Covert, NP (Pain Medicine)- seen for follow up of chronic pain, no medication changes, follow up 8 weeks 04/25/20- Lorne Skeens, MD ( Gastroenterology)- seen for initial consult for diarrhea, EGD/ colonoscopy scheduled patient to be admitted for 2 day bowel preparation, no medication changes  Hospital visits:  Medication Reconciliation was completed by comparing discharge summary, patient's EMR and Pharmacy list, and upon discussion with patient.  Admitted to the hospital on 07/18/20 due to diarrhea/ EGD. Discharge date was 07/20/20. Discharged from [facility].    New?Medications Started at [facility] Discharge:?? Pantoprazole 40 mg one tab daily   Medications Discontinued at Hospital Discharge: Glycopyrrolate 2 mg   All other medications  remain the same after Hospital Discharge:??    Medications: Outpatient Encounter Medications as of 10/16/2020  Medication Sig   AMBULATORY NON FORMULARY MEDICATION Cane for ambulation, heavy duty   amLODipine (NORVASC) 5 MG tablet TAKE 1 TABLET BY MOUTH EVERY DAY   Cholecalciferol 50 MCG (2000 UT) TABS Take by mouth.   colestipol (COLESTID) 1 g tablet Take 2 g by mouth in the morning and at bedtime. Take two tablets (2 G Dose) by mouth 2 (two) times daily, 2 hours apart from all other meds    dicyclomine (BENTYL) 20 MG tablet Take 1 tablet (20 mg total) by mouth 3 (three) times daily before meals.   diphenhydrAMINE (BENADRYL) 25 MG tablet Take 1 tablet by mouth every 4 (four) hours as needed.   diphenoxylate-atropine (LOMOTIL) 2.5-0.025 MG tablet Take 1-2 tablets by mouth 4 (four) times daily as needed for diarrhea or loose stools.   docusate sodium (COLACE) 100 MG capsule Take 100 mg by mouth as needed.     fexofenadine (ALLEGRA) 60 MG tablet Take 1 tablet by mouth daily.   FLUoxetine (PROZAC) 40 MG capsule Take 1 capsule by mouth daily.   fluticasone (FLONASE) 50 MCG/ACT nasal spray INSTILL 1 SPRAY IN EACH NOSTRIL DAILY   gabapentin (NEURONTIN) 600 MG tablet Take 2 tablets (1,200 mg total) by mouth 3 (three) times daily.   HYDROcodone-acetaminophen (NORCO) 10-325 MG tablet Take 1 tablet by mouth every 6 (six) hours as needed.   hyoscyamine (LEVBID) 0.375 MG 12 hr tablet TAKE ONE TABLET (0.375 MG DOSE) BY MOUTH 2 (TWO) TIMES DAILY. (Patient not taking: Reported on 10/02/2020)   ibuprofen (ADVIL,MOTRIN) 600 MG tablet Take 1 tablet by mouth 3 (three) times daily.   ipratropium (ATROVENT) 0.03 % nasal spray Place 2 sprays into both nostrils every 12 (twelve) hours.   lidocaine (LIDODERM) 5 % Place 1 patch onto the skin daily. Remove & Discard patch within 12 hours or as directed by MD   methscopolamine (PAMINE FORTE) 5 MG tablet Take 1 tablet (5 mg total) by mouth 2 (two) times daily.   NARCAN 4 MG/0.1ML LIQD nasal spray kit ONE SPRAY  BY NASAL ROUTE ONCE AS NEEDED FOR UP TO 1 DOSE.   NUCYNTA ER 250 MG TB12 Take 1 tablet by mouth 2 (two) times daily.   omeprazole (PRILOSEC) 40 MG capsule Take 40 mg by mouth 2 (two) times daily.   Sennosides 25 MG TABS Take by mouth 3 (three) times daily.     tiZANidine (ZANAFLEX) 4 MG tablet TAKE ONE TABLET BY MOUTH BEDTIME   triprolidine-pseudoephedrine (APRODINE) 2.5-60 MG TABS tablet Take 1 tablet by mouth every 6 (six) hours as needed.   No  facility-administered encounter medications on file as of 10/16/2020.    Current Documented Medications  amLODipine 5 MG- 90 DS last filled 05/21/20 Cholecalciferol 50 MCG  colestipol  1 g - 90 DS last filled 08/24/19 dicyclomine 20 MG- 90 DS last filled 12/08/19 diphenhydrAMINE 25 MG  diphenoxylate-atropine 2.5-0.025 MG - 3 DS last filled 12/08/19 docusate sodium 100 MG fexofenadine 60 MG  FLUoxetine  40 MG  fluticasone 50 MCG/ACT nasal spray gabapentin  600 MG- 30 DS last filled 03/27/20 HYDROcodone-acetaminophen 10-325 MG - 30 DS last filled 10/04/20 hyoscyamine 0.375 MG 12 hr ibuprofen 600 MG ipratropium  0.03 % nasal spray- 90 DS last filled 12/08/19 lidocaine  5 % methscopolamine 5 MG- 30  DS last filled 09/05/19 NARCAN 4 MG/0.1ML LIQD nasal spray kit NUCYNTA ER 250 MG - Discontinued by patient due to cost  omeprazole 40 MG Sennosides 25 MG tiZANidine 4 MG  triprolidine-pseudoephedrine 2.5-60 MG   [employer], CMA

## 2020-10-23 ENCOUNTER — Other Ambulatory Visit: Payer: Self-pay

## 2020-10-23 ENCOUNTER — Ambulatory Visit (INDEPENDENT_AMBULATORY_CARE_PROVIDER_SITE_OTHER): Payer: Medicare Other | Admitting: Pharmacist

## 2020-10-23 DIAGNOSIS — E785 Hyperlipidemia, unspecified: Secondary | ICD-10-CM | POA: Diagnosis not present

## 2020-10-23 DIAGNOSIS — I10 Essential (primary) hypertension: Secondary | ICD-10-CM | POA: Diagnosis not present

## 2020-10-23 NOTE — Patient Instructions (Signed)
Visit Information   PATIENT GOALS:   Goals Addressed             This Visit's Progress    Medication Management       Patient Goals/Self-Care Activities Over the next 365 days, patient will:  take medications as prescribed  Follow Up Plan: Telephone follow up appointment with care management team member scheduled for:  1 year         Consent to CCM Services: John Scott was given information about Chronic Care Management services including:  CCM service includes personalized support from designated clinical staff supervised by his physician, including individualized plan of care and coordination with other care providers 24/7 contact phone numbers for assistance for urgent and routine care needs. Service will only be billed when office clinical staff spend 20 minutes or more in a month to coordinate care. Only one practitioner may furnish and bill the service in a calendar month. The patient may stop CCM services at any time (effective at the end of the month) by phone call to the office staff. The patient will be responsible for cost sharing (co-pay) of up to 20% of the service fee (after annual deductible is met).  Patient agreed to services and verbal consent obtained.   The patient verbalized understanding of instructions, educational materials, and care plan provided today and agreed to receive a mailed copy of patient instructions, educational materials, and care plan.   Telephone follow up appointment with care management team member scheduled for: 1 yr [place]: Patient Care Plan: Medication Management     Problem Identified: HTN, HLD      Long-Range Goal: Disease Progression Prevention   Start Date: 10/23/2020  This Visit's Progress: On track  Priority: High  Note:   Current Barriers:  None at present  Pharmacist Clinical Goal(s):  Over the next 365 days, patient will maintain control of chronic conditions as evidenced by lab  values, vital signs, and medication fill history  through collaboration with PharmD and provider.   Interventions: 1:1 collaboration with Hali Marry, MD regarding development and update of comprehensive plan of care as evidenced by provider attestation and co-signature Inter-disciplinary care team collaboration (see longitudinal plan of care) Comprehensive medication review performed; medication list updated in electronic medical record  Hypertension:  Controlled; current treatment:amlodipine 90m daily;   Current home readings: not currently checking but pt states BP usually runs 140s/80s at pain mgmt visists  Denies hypotensive/hypertensive symptoms  Recommended check BP at home once every 1-2 weeks, continue current regimen, notify PCP if consistently >140/80    Hyperlipidemia:  Controlled; current treatment:colestipol 2g BID (actually used for diarrhea treatment, pt is not currently taking). ;   Counseled on dietary modifications to promote cholesterol control Recommended repeat lipid panel at next PCP visit, consider statin if LDL >100, as ASCVD risk is 31%  Patient Goals/Self-Care Activities Over the next 365 days, patient will:  take medications as prescribed  Follow Up Plan: Telephone follow up appointment with care management team member scheduled for:  1 year

## 2020-10-23 NOTE — Progress Notes (Signed)
Chronic Care Management Pharmacy Note  10/23/2020 Name:  John Scott MRN:  [id number] DOB:  [birth year]  Summary: addressed HTN, HLD. During medication review, patient was noted to be taking both omeprazole and pantoprazole.   Recommendations/Changes made from today's visit: Recommended discontinue pantoprazole to avoid therapeutic duplication.  Recommended repeat lipid panel at next PCP visit, consider statin if LDL >100, as ASCVD risk is 31%  Plan: f/u with pharmacist in 1 yr  Subjective: John Scott is an 70 y.o. year old male who is a primary patient of Metheney, Rene Kocher, MD.  The CCM team was consulted for assistance with disease management and care coordination needs.    Engaged with patient by telephone for initial visit in response to provider referral for pharmacy case management and/or care coordination services.   Consent to Services:  The patient was given information about Chronic Care Management services, agreed to services, and gave verbal consent prior to initiation of services.  Please see initial visit note for detailed documentation.   Patient Care Team: Hali Marry, MD as PCP - General (Family Medicine) Lennie Odor as Referring Physician (Pain Medicine) Darius Bump, [facility] as Pharmacist (Pharmacist)  Recent office visits:  No visits noted   Recent consult visits:  10/04/20- Lind Covert, NP (Pain Medicine)- seen for follow up of back pain, increased norco 10/325 mg to four times daily prn, discontinued nucynta er due to cost, follow up 8 weeks 09/18/20- Left S1 Transforaminal Epidural Steroid Injection for back/leg pain by Wylene Men, MD at pain medicine 08/09/20-  Lind Covert, NP (Pain Medicine)- seen for follow up of chronic pain, no medication changes, follow up 8 weeks 04/25/20- Lorne Skeens, MD ( Gastroenterology)- seen for initial consult for diarrhea, EGD/ colonoscopy scheduled patient to be admitted for 2 day bowel preparation,  no medication changes   Hospital visits:   Admitted to the hospital on 07/18/20 due to diarrhea/ EGD. Discharge date was 07/20/20. Discharged from [facility].     New?Medications Started at [facility] Discharge:?? Pantoprazole 40 mg one tab daily    Medications Discontinued at Hospital Discharge: Glycopyrrolate 2 mg    All other medications  remain the same after Hospital Discharge.  Objective:  Lab Results  Component Value Date   CREATININE 0.78 08/31/2019   CREATININE 0.80 02/18/2019   CREATININE 0.98 06/11/2011    No results found for: HGBA1C Last diabetic Eye exam: No results found for: HMDIABEYEEXA  Last diabetic Foot exam: No results found for: HMDIABFOOTEX      Component Value Date/Time   CHOL 212 (H) 02/18/2019 1339   TRIG 125 02/18/2019 1339   HDL 61 02/18/2019 1339   CHOLHDL 3.5 02/18/2019 1339   VLDL NOT CALC 06/11/2011 1130   LDLCALC 127 (H) 02/18/2019 1339    Hepatic Function Latest Ref Rng & Units 02/18/2019 06/11/2011 12/17/2007  Total Protein 6.1 - 8.1 g/dL 6.4 6.6 6.3  Albumin 3.5 - 5.2 g/dL - 4.1 3.9  AST 10 - 35 U/L 16 17 33  ALT 9 - 46 U/L '12 14 25  ' Alk Phosphatase 39 - 117 U/L - 90 75  Total Bilirubin 0.2 - 1.2 mg/dL 0.4 0.3 0.3    Lab Results  Component Value Date/Time   TSH 2.361 12/17/2007 04:14 AM   TSH 2.6 09/22/2006 12:00 AM    CBC Latest Ref Rng & Units 02/18/2019 05/27/2007 09/22/2006  WBC 3.8 - 10.8 Thousand/uL 5.5 5.8 -  Hemoglobin 13.2 -  17.1 g/dL 15.0 14.0 14.6  Hematocrit 38.5 - 50.0 % 44.8 42.6 45.2  Platelets 140 - 400 Thousand/uL 229 283 220     Clinical ASCVD: Yes  The 10-year ASCVD risk score Mikey Bussing DC Jr., et al., 2013) is: 31.7%*   Values used to calculate the score:     Age: 6 years     Sex: Male     Is Non-Hispanic African American: No     Diabetic: No     Tobacco smoker: Yes     Systolic Blood Pressure: 226 mmHg     Is BP treated: Yes     HDL Cholesterol: 61 mg/dL     Total Cholesterol:  217 mg/dL*     * - Cholesterol units were assumed for this score calculation     Social History   Tobacco Use  Smoking Status Every Day   Types: Cigars, Cigarettes   Last attempt to quit: 05/06/2010   Years since quitting: 10.4  Smokeless Tobacco Never   BP Readings from Last 3 Encounters:  10/02/20 (!) 153/92  04/17/20 133/78  12/08/19 (!) 119/59   Pulse Readings from Last 3 Encounters:  10/02/20 76  04/17/20 78  12/08/19 70   Wt Readings from Last 3 Encounters:  10/02/20 235 lb 1.9 oz (106.6 kg)  12/08/19 218 lb (98.9 kg)  08/31/19 216 lb (98 kg)    Assessment: Review of patient past medical history, allergies, medications, health status, including review of consultants reports, laboratory and other test data, was performed as part of comprehensive evaluation and provision of chronic care management services.   SDOH:  (Social Determinants of Health) assessments and interventions performed:    CCM Care Plan  No Known Allergies  Medications Reviewed Today     Reviewed by Tinnie Gens, RN (Registered Nurse) on 10/02/20 at [place] List Status: <None>   Medication Order Taking? Sig Documenting Provider Last Dose Status Informant  AMBULATORY NON FORMULARY MEDICATION [id number] Yes Cane for ambulation, heavy duty Silverio Decamp, MD Taking Active   amLODipine (NORVASC) 5 MG tablet [id number] Yes TAKE 1 TABLET BY MOUTH EVERY DAY Hali Marry, MD Taking Active   colestipol (COLESTID) 1 g tablet [id number] Yes Take 2 g by mouth in the morning and at bedtime. Take two tablets (2 G Dose) by mouth 2 (two) times daily, 2 hours apart from all other meds [provider] Taking Active   dicyclomine (BENTYL) 20 MG tablet [id number] Yes Take 1 tablet (20 mg total) by mouth 3 (three) times daily before meals. Hali Marry, MD Taking Active   diphenhydrAMINE (BENADRYL) 25 MG tablet [id number] Yes Take 1 tablet by mouth every 4 (four) hours as needed.  [provider] Taking Active   diphenoxylate-atropine (LOMOTIL) 2.5-0.025 MG tablet [id number] Yes Take 1-2 tablets by mouth 4 (four) times daily as needed for diarrhea or loose stools. Hali Marry, MD Taking Active   docusate sodium (COLACE) 100 MG capsule [id number] Yes Take 100 mg by mouth as needed.   [provider] Taking Active   fexofenadine (ALLEGRA) 60 MG tablet [id number] Yes Take 1 tablet by mouth daily. [provider] Taking Active   FLUoxetine (PROZAC) 40 MG capsule [id number] Yes Take 1 capsule by mouth daily. [provider] Taking Active   gabapentin (NEURONTIN) 600 MG tablet [id number] Yes Take 2 tablets (1,200 mg total) by mouth 3 (three) times daily. Hali Marry, MD Taking Active   HYDROcodone-acetaminophen [facility]) 10-325 MG tablet  [id number] Yes Take 1 tablet by mouth every 6 (six) hours as needed. [provider] Taking Active Self  hyoscyamine (LEVBID) 0.375 MG 12 hr tablet [id number] No TAKE ONE TABLET (0.375 MG DOSE) BY MOUTH 2 (TWO) TIMES DAILY.  Patient not taking: Reported on 10/02/2020   [provider] Not Taking Active   ibuprofen (ADVIL,MOTRIN) 600 MG tablet [id number] Yes Take 1 tablet by mouth 3 (three) times daily. [provider] Taking Active   ipratropium (ATROVENT) 0.03 % nasal spray [id number] Yes Place 2 sprays into both nostrils every 12 (twelve) hours. Hali Marry, MD Taking Active   lidocaine (LIDODERM) 5 % [id number] Yes Place 1 patch onto the skin daily. Remove & Discard patch within 12 hours or as directed by MD [provider] Taking Active   methscopolamine (PAMINE FORTE) 5 MG tablet [id number] Yes Take 1 tablet (5 mg total) by mouth 2 (two) times daily. Hali Marry, MD Taking Active   NARCAN 4 MG/0.1ML LIQD nasal spray kit [id number] Yes ONE SPRAY BY NASAL ROUTE ONCE AS NEEDED FOR UP TO 1 DOSE. [provider] Taking Active   NUCYNTA ER 250 MG  TB12 [id number] Yes Take 1 tablet by mouth 2 (two) times daily. Lennie Odor, MD Taking Active   omeprazole (PRILOSEC) 40 MG capsule [id number] Yes Take 40 mg by mouth 2 (two) times daily. Gerre Pebbles, MD Taking Active   Sennosides 25 MG TABS [id number] Yes Take by mouth 3 (three) times daily.   [provider] Taking Active   triprolidine-pseudoephedrine (APRODINE) 2.5-60 MG TABS tablet [id number] Yes Take 1 tablet by mouth every 6 (six) hours as needed. 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   Fibromyalgia 05/03/2018   Degeneration of lumbar intervertebral disc 05/03/2018   Long term (current) use of opiate analgesic 05/03/2018   Myofascial pain 04/30/2016   Pressure sore 02/29/2016   Onychodystrophy 02/29/2016   Idiopathic peripheral neuropathy 03/23/2015   Essential hypertension 10/11/2014   Lumbar radiculopathy 05/22/2014   Left leg pain 12/12/2011   Left leg weakness 12/12/2011   Functional diarrhea 06/10/2010   HYPERLIPIDEMIA 12/16/2007   SOB 10/07/2007   Postlaminectomy syndrome 09/17/2007   BENIGN PROSTATIC HYPERTROPHY, WITH URINARY OBSTRUCTION 06/28/2007   CHRONIC RHINITIS 05/27/2007   LEG EDEMA 05/27/2007   MDD (major depressive disorder), recurrent episode ([place]) 02/10/2007   SPONDYLOSIS, LUMBOSACRAL 02/10/2007   Pain management 02/10/2007    Immunization History  Administered Date(s) Administered   Fluad Quad(high Dose 65+) 11/02/2018, 12/08/2019   H1N1 02/24/2008   Influenza Split 11/12/2011   Influenza Whole 12/16/2007, 12/08/2008, 11/11/2012   Influenza, High Dose Seasonal PF 01/15/2017, 02/01/2018   Influenza-Unspecified 12/16/2014   Moderna SARS-COV2 Booster  Vaccination 05/24/2020   Moderna Sars-Covid-2 Vaccination 03/14/2020   Pneumococcal Conjugate-13 05/13/2016   Pneumococcal Polysaccharide-23 12/16/2007, 05/03/2018   Td 09/14/2003, 01/14/2004, 12/16/2007   Tdap 02/01/2018   Tetanus 08/16/1994   Zoster Recombinat (Shingrix) 10/02/2017, 02/01/2018    Conditions to be addressed/monitored: HTN and HLD  There are no care plans that you recently modified to display for this patient.   Medication Assistance: None required.  Patient affirms current coverage meets needs.  Patient's  preferred pharmacy is:  [place], [place] - [address] [place] [address] Phone: [phone] Fax: [phone]  [id number] [place] [address]Phone: [phone]: [phone] Uses pill box? No - keeps medication in pill bottles in a basket, working well for him  Pt endorses 100% compliance  Follow Up:  Patient agrees to Care Plan and Follow-up.  Plan: Telephone follow up appointment with care management team member scheduled for:  1 year  KDarius Bump

## 2020-11-13 DIAGNOSIS — G8929 Other chronic pain: Secondary | ICD-10-CM | POA: Diagnosis not present

## 2020-11-13 DIAGNOSIS — G894 Chronic pain syndrome: Secondary | ICD-10-CM | POA: Diagnosis not present

## 2020-11-13 DIAGNOSIS — M5417 Radiculopathy, lumbosacral region: Secondary | ICD-10-CM | POA: Diagnosis not present

## 2020-11-13 DIAGNOSIS — M5442 Lumbago with sciatica, left side: Secondary | ICD-10-CM | POA: Diagnosis not present

## 2020-11-13 DIAGNOSIS — M5136 Other intervertebral disc degeneration, lumbar region: Secondary | ICD-10-CM | POA: Diagnosis not present

## 2020-11-13 DIAGNOSIS — M5416 Radiculopathy, lumbar region: Secondary | ICD-10-CM | POA: Diagnosis not present

## 2020-11-13 DIAGNOSIS — M549 Dorsalgia, unspecified: Secondary | ICD-10-CM | POA: Diagnosis not present

## 2020-11-13 DIAGNOSIS — M47816 Spondylosis without myelopathy or radiculopathy, lumbar region: Secondary | ICD-10-CM | POA: Diagnosis not present

## 2020-11-13 DIAGNOSIS — M961 Postlaminectomy syndrome, not elsewhere classified: Secondary | ICD-10-CM | POA: Diagnosis not present

## 2020-12-13 ENCOUNTER — Ambulatory Visit (INDEPENDENT_AMBULATORY_CARE_PROVIDER_SITE_OTHER): Payer: Medicare Other

## 2020-12-13 ENCOUNTER — Other Ambulatory Visit: Payer: Self-pay

## 2020-12-13 ENCOUNTER — Ambulatory Visit (INDEPENDENT_AMBULATORY_CARE_PROVIDER_SITE_OTHER): Payer: Medicare Other | Admitting: Family Medicine

## 2020-12-13 ENCOUNTER — Encounter: Payer: Self-pay | Admitting: Family Medicine

## 2020-12-13 VITALS — BP 143/85 | HR 73 | Wt 226.0 lb

## 2020-12-13 DIAGNOSIS — I10 Essential (primary) hypertension: Secondary | ICD-10-CM | POA: Diagnosis not present

## 2020-12-13 DIAGNOSIS — R059 Cough, unspecified: Secondary | ICD-10-CM

## 2020-12-13 DIAGNOSIS — K529 Noninfective gastroenteritis and colitis, unspecified: Secondary | ICD-10-CM

## 2020-12-13 DIAGNOSIS — J988 Other specified respiratory disorders: Secondary | ICD-10-CM

## 2020-12-13 DIAGNOSIS — J9811 Atelectasis: Secondary | ICD-10-CM | POA: Diagnosis not present

## 2020-12-13 MED ORDER — DICYCLOMINE HCL 20 MG PO TABS: 20.0000 mg | ORAL_TABLET | Freq: Three times a day (TID) | ORAL | 5 refills | Status: AC

## 2020-12-13 MED ORDER — AMLODIPINE BESYLATE 5 MG PO TABS: 5.0000 mg | ORAL_TABLET | Freq: Every day | ORAL | 1 refills | Status: DC

## 2020-12-13 MED ORDER — PREDNISONE 20 MG PO TABS
40.0000 mg | ORAL_TABLET | Freq: Every day | ORAL | 0 refills | Status: AC
Start: 2020-12-13 — End: 2020-12-18

## 2020-12-13 NOTE — Patient Instructions (Addendum)
Try to avoid over-the-counter decongestants - this can raise your blood pressure Try Coricidin or Mucinex GI referral placed
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
## 2020-12-14 DIAGNOSIS — M4727 Other spondylosis with radiculopathy, lumbosacral region: Secondary | ICD-10-CM | POA: Diagnosis not present

## 2020-12-14 DIAGNOSIS — G8929 Other chronic pain: Secondary | ICD-10-CM | POA: Diagnosis not present

## 2020-12-14 DIAGNOSIS — M5116 Intervertebral disc disorders with radiculopathy, lumbar region: Secondary | ICD-10-CM | POA: Diagnosis not present

## 2020-12-14 DIAGNOSIS — F32A Depression, unspecified: Secondary | ICD-10-CM | POA: Diagnosis not present

## 2020-12-14 DIAGNOSIS — M4726 Other spondylosis with radiculopathy, lumbar region: Secondary | ICD-10-CM | POA: Diagnosis not present

## 2020-12-14 DIAGNOSIS — M5117 Intervertebral disc disorders with radiculopathy, lumbosacral region: Secondary | ICD-10-CM | POA: Diagnosis not present

## 2020-12-14 DIAGNOSIS — M549 Dorsalgia, unspecified: Secondary | ICD-10-CM | POA: Diagnosis not present

## 2020-12-14 DIAGNOSIS — M5416 Radiculopathy, lumbar region: Secondary | ICD-10-CM | POA: Diagnosis not present

## 2020-12-14 DIAGNOSIS — F17200 Nicotine dependence, unspecified, uncomplicated: Secondary | ICD-10-CM | POA: Diagnosis not present

## 2020-12-14 DIAGNOSIS — E785 Hyperlipidemia, unspecified: Secondary | ICD-10-CM | POA: Diagnosis not present

## 2020-12-14 DIAGNOSIS — I10 Essential (primary) hypertension: Secondary | ICD-10-CM | POA: Diagnosis not present

## 2020-12-14 DIAGNOSIS — K219 Gastro-esophageal reflux disease without esophagitis: Secondary | ICD-10-CM | POA: Diagnosis not present

## 2020-12-14 MED ORDER — AMOXICILLIN-POT CLAVULANATE 875-125 MG PO TABS: 1.0000 | ORAL_TABLET | Freq: Two times a day (BID) | ORAL | 0 refills | Status: AC

## 2020-12-19 ENCOUNTER — Telehealth: Payer: Self-pay | Admitting: *Deleted

## 2020-12-19 NOTE — Telephone Encounter (Signed)
Pt called and stated that he continues to feel like he is stuffed and in pain whether he eats or just drinks water.  Pt states that he had an MRI done at [place] (not ordered by our office) this was for his spine? He said he thought that something was going to be done for his GI  sxs.  I told him that [facility] Le placed a referral for him and that their office did call and lvm for him on 9/30. I gave him the phone # to call their office back to try to schedule an appt for the GI issues.

## 2020-12-21 DIAGNOSIS — I10 Essential (primary) hypertension: Secondary | ICD-10-CM | POA: Diagnosis not present

## 2020-12-21 LAB — COMPREHENSIVE METABOLIC PANEL
AG Ratio: 2 (calc) (ref 1.0–2.5)
ALT: 20 U/L (ref 9–46)
AST: 15 U/L (ref 10–35)
Albumin: 4.2 g/dL (ref 3.6–5.1)
Alkaline phosphatase (APISO): 105 U/L (ref 35–144)
BUN: 15 mg/dL (ref 7–25)
CO2: 25 mmol/L (ref 20–32)
Calcium: 9 mg/dL (ref 8.6–10.3)
Chloride: 105 mmol/L (ref 98–110)
Creat: 0.96 mg/dL (ref 0.70–1.28)
Globulin: 2.1 g/dL (calc) (ref 1.9–3.7)
Glucose, Bld: 87 mg/dL (ref 65–99)
Potassium: 4.2 mmol/L (ref 3.5–5.3)
Sodium: 139 mmol/L (ref 135–146)
Total Bilirubin: 0.7 mg/dL (ref 0.2–1.2)
Total Protein: 6.3 g/dL (ref 6.1–8.1)

## 2020-12-21 LAB — CBC
HCT: 49.5 % (ref 38.5–50.0)
Hemoglobin: 16.9 g/dL (ref 13.2–17.1)
MCH: 31.6 pg (ref 27.0–33.0)
MCHC: 34.1 g/dL (ref 32.0–36.0)
MCV: 92.5 fL (ref 80.0–100.0)
MPV: 8.8 fL (ref 7.5–12.5)
Platelets: 294 10*3/uL (ref 140–400)
RBC: 5.35 10*6/uL (ref 4.20–5.80)
RDW: 12.7 % (ref 11.0–15.0)
WBC: 10.5 10*3/uL (ref 3.8–10.8)

## 2020-12-28 ENCOUNTER — Telehealth: Payer: Self-pay | Admitting: Gastroenterology

## 2020-12-28 NOTE — Telephone Encounter (Signed)
Hey Dr Lyndel Safe, this pt is being referred to [place] from [facility] for Chronic diarrhea but it looks like the pt has seen Dr Christoper Fabian 2022, records are in epic for review, please advise on scheduling

## 2021-01-02 NOTE — Telephone Encounter (Signed)
Okay to schedule in APP clinic or mine whichever is faster RG

## 2021-01-10 ENCOUNTER — Ambulatory Visit (HOSPITAL_BASED_OUTPATIENT_CLINIC_OR_DEPARTMENT_OTHER)
Admission: RE | Admit: 2021-01-10 | Discharge: 2021-01-10 | Disposition: A | Discharge status: Home / Self Care | Payer: Medicare Other | Source: Ambulatory Visit | Attending: Physician Assistant | Admitting: Physician Assistant

## 2021-01-10 ENCOUNTER — Ambulatory Visit (INDEPENDENT_AMBULATORY_CARE_PROVIDER_SITE_OTHER): Payer: Medicare Other | Admitting: Physician Assistant

## 2021-01-10 ENCOUNTER — Encounter: Payer: Self-pay | Admitting: Physician Assistant

## 2021-01-10 ENCOUNTER — Other Ambulatory Visit: Payer: Self-pay

## 2021-01-10 VITALS — BP 121/81 | HR 89 | Ht 74.0 in | Wt 229.0 lb

## 2021-01-10 DIAGNOSIS — R1032 Left lower quadrant pain: Secondary | ICD-10-CM

## 2021-01-10 DIAGNOSIS — K529 Noninfective gastroenteritis and colitis, unspecified: Secondary | ICD-10-CM | POA: Diagnosis not present

## 2021-01-10 DIAGNOSIS — M47816 Spondylosis without myelopathy or radiculopathy, lumbar region: Secondary | ICD-10-CM | POA: Diagnosis not present

## 2021-01-10 DIAGNOSIS — M5417 Radiculopathy, lumbosacral region: Secondary | ICD-10-CM | POA: Diagnosis not present

## 2021-01-10 DIAGNOSIS — M5416 Radiculopathy, lumbar region: Secondary | ICD-10-CM | POA: Diagnosis not present

## 2021-01-10 DIAGNOSIS — M961 Postlaminectomy syndrome, not elsewhere classified: Secondary | ICD-10-CM | POA: Diagnosis not present

## 2021-01-10 DIAGNOSIS — I700 Atherosclerosis of aorta: Secondary | ICD-10-CM | POA: Diagnosis not present

## 2021-01-10 DIAGNOSIS — R188 Other ascites: Secondary | ICD-10-CM | POA: Diagnosis not present

## 2021-01-10 DIAGNOSIS — R197 Diarrhea, unspecified: Secondary | ICD-10-CM | POA: Diagnosis not present

## 2021-01-10 DIAGNOSIS — G8929 Other chronic pain: Secondary | ICD-10-CM | POA: Diagnosis not present

## 2021-01-10 DIAGNOSIS — M549 Dorsalgia, unspecified: Secondary | ICD-10-CM | POA: Diagnosis not present

## 2021-01-10 DIAGNOSIS — G894 Chronic pain syndrome: Secondary | ICD-10-CM | POA: Diagnosis not present

## 2021-01-10 DIAGNOSIS — M5442 Lumbago with sciatica, left side: Secondary | ICD-10-CM | POA: Diagnosis not present

## 2021-01-10 DIAGNOSIS — N281 Cyst of kidney, acquired: Secondary | ICD-10-CM | POA: Diagnosis not present

## 2021-01-10 DIAGNOSIS — M5136 Other intervertebral disc degeneration, lumbar region: Secondary | ICD-10-CM | POA: Diagnosis not present

## 2021-01-10 MED ORDER — IOHEXOL 300 MG/ML  SOLN
100.0000 mL | Freq: Once | INTRAMUSCULAR | Status: AC | PRN
  Administered 2021-01-10: 100 mL via INTRAVENOUS

## 2021-01-10 NOTE — Patient Instructions (Signed)
Med center [facility] for CT of abdomen to rule out diverticulitis at 5:15. Go downstairs to start drinking oral.
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
## 2021-01-11 NOTE — Progress Notes (Signed)
CT shows some mild colitis which means inflammation. This usually will self resolve but I do want you to get stool culture and C.diff stools studies to make sure there is nothing else going on. Ok to take imodium or lomotil for diarrhea.

## 2021-01-14 NOTE — Progress Notes (Signed)
Can we find out how patient is doing?  Emphysema is chronic but can have acute flares. Any fevers? Cough productive? Ok to send prednisone 50mg  one po qd #5. If still having other symptoms let me know. Colitis can take a few weeks to completely resolve flare.

## 2021-01-17 NOTE — Progress Notes (Signed)
Over-the-counter Sudafed and the behind the counter Sudafed are slightly different.  So if he is taking the over-the-counter 1 then he can talk to the pharmacist and get the 1 that he has to show his license for.  Personally I feel like it is little bit more effective.  If he is already using that 1 then he could switch to the over-the-counter 1 which is slightly different to see if he feels if that is effective.  Is like he had antibiotics recently already as well as prednisone.  If he is getting worse then we may need to consider chest x-ray

## 2021-03-21 DIAGNOSIS — F331 Major depressive disorder, recurrent, moderate: Secondary | ICD-10-CM | POA: Diagnosis not present

## 2021-03-22 ENCOUNTER — Other Ambulatory Visit: Payer: Self-pay | Admitting: *Deleted

## 2021-03-22 DIAGNOSIS — Z87891 Personal history of nicotine dependence: Secondary | ICD-10-CM

## 2021-03-24 DIAGNOSIS — F331 Major depressive disorder, recurrent, moderate: Secondary | ICD-10-CM | POA: Diagnosis not present

## 2021-04-15 ENCOUNTER — Other Ambulatory Visit: Payer: Self-pay

## 2021-04-15 ENCOUNTER — Encounter: Payer: Self-pay | Admitting: Acute Care

## 2021-04-15 ENCOUNTER — Ambulatory Visit (INDEPENDENT_AMBULATORY_CARE_PROVIDER_SITE_OTHER): Payer: Medicare Other | Admitting: Acute Care

## 2021-04-15 DIAGNOSIS — F17210 Nicotine dependence, cigarettes, uncomplicated: Secondary | ICD-10-CM

## 2021-04-15 NOTE — Progress Notes (Signed)
Virtual Visit via Telephone Note  I connected with John Scott on 04/15/21 at  4:00 PM EST by telephone and verified that I am speaking with the correct person using two identifiers.  Location: Patient:  At home Provider:  [place], [place], [place], [address]    I discussed the limitations, risks, security and privacy concerns of performing an evaluation and management service by telephone and the availability of in person appointments. I also discussed with the patient that there may be a patient responsible charge related to this service. The patient expressed understanding and agreed to proceed.   Shared Decision Making Visit Lung Cancer Screening Program [phone])   Eligibility: Age 71 y.o. Pack Years Smoking History Calculation 39 pack year smoking history (# packs/per year x # years smoked) Recent History of coughing up blood  no Unexplained weight loss? no ( >Than 15 pounds within the last 6 months ) Prior History Lung / other cancer no (Diagnosis within the last 5 years already requiring surveillance chest CT Scans). Smoking Status Current Smoker>> Current some day smoker Former Smokers: Years since quit:  NA  Quit Date:  NA  Visit Components: Discussion included one or more decision making aids. yes Discussion included risk/benefits of screening. yes Discussion included potential follow up diagnostic testing for abnormal scans. yes Discussion included meaning and risk of over diagnosis. yes Discussion included meaning and risk of False Positives. yes Discussion included meaning of total radiation exposure. yes  Counseling Included: Importance of adherence to annual lung cancer LDCT screening. yes Impact of comorbidities on ability to participate in the program. yes Ability and willingness to under diagnostic treatment. yes  Smoking Cessation Counseling: Current Smokers:  Discussed importance of smoking cessation. yes Information about tobacco  cessation classes and interventions provided to patient. yes Patient provided with "ticket" for LDCT Scan. yes Symptomatic Patient. no  Counseling NA Diagnosis Code: Tobacco Use Z72.0 Asymptomatic Patient yes  Counseling (Intermediate counseling: > three minutes counseling) B8466 Former Smokers:  Discussed the importance of maintaining cigarette abstinence. yes Diagnosis Code: Personal History of Nicotine Dependence. Z99.357 Information about tobacco cessation classes and interventions provided to patient. Yes Patient provided with "ticket" for LDCT Scan. yes Written Order for Lung Cancer Screening with LDCT placed in Epic. Yes (CT Chest Lung Cancer Screening Low Dose W/O CM) [id number] Z12.2-Screening of respiratory organs Z87.891-Personal history of nicotine dependence  I have spent 25 minutes of face to face/ virtual visit   time with  John Scott discussing the risks and benefits of lung cancer screening. We viewed / discussed a power point together that explained in detail the above noted topics. We paused at intervals to allow for questions to be asked and answered to ensure understanding.We discussed that the single most powerful action that he can take to decrease his risk of developing lung cancer is to quit smoking. We discussed whether or not he is ready to commit to setting a quit date. We discussed options for tools to aid in quitting smoking including nicotine replacement therapy, non-nicotine medications, support groups, Quit Smart classes, and behavior modification. We discussed that often times setting smaller, more achievable goals, such as eliminating 1 cigarette a day for a week and then 2 cigarettes a day for a week can be helpful in slowly decreasing the number of cigarettes smoked. This allows for a sense of accomplishment as well as providing a clinical benefit. I provided  him  with smoking cessation  information  with contact  information for community resources, classes, free  nicotine replacement therapy, and access to mobile apps, text messaging, and on-line smoking cessation help. I have also provided  him  the office contact information in the event he needs to contact me, or the screening staff. We discussed the time and location of the scan, and that either Doroteo Glassman RN, Joella Prince, RN  or I will call / send a letter with the results within 24-72 hours of receiving them. The patient verbalized understanding of all of  the above and had no further questions upon leaving the office. They have my contact information in the event they have any further questions.  I spent 3 minutes counseling on smoking cessation and the health risks of continued tobacco abuse.  I explained to the patient that there has been a high incidence of coronary artery disease noted on these exams. I explained that this is a non-gated exam therefore degree or severity cannot be determined. This patient is not on statin therapy. I have asked the patient to follow-up with their PCP regarding any incidental finding of coronary artery disease and management with diet or medication as their PCP  feels is clinically indicated. The patient verbalized understanding of the above and had no further questions upon completion of the visit.      Magdalen Spatz, NP 04/15/2021

## 2021-04-15 NOTE — Patient Instructions (Signed)
Thank you for participating in the [facility]. °It was our pleasure to meet you today. °We will call you with the results of your scan within the next few days. °Your scan will be assigned a Lung RADS category score by the physicians reading the scans.  °This Lung RADS score determines follow up scanning.  °See below for description of categories, and follow up screening recommendations. °We will be in touch to schedule your follow up screening annually or based on recommendations of our providers. °We will fax a copy of your scan results to your Primary Care Physician, or the physician who referred you to the program, to ensure they have the results. °Please call the office if you have any questions or concerns regarding your scanning experience or results.  °Our office number is [phone]. °Please speak with Denise Phelps, RN. She is our Lung Cancer Screening RN. °If she is unavailable when you call, please have the office staff send her a message. She will return your call at her earliest convenience. °Remember, if your scan is normal, we will scan you annually as long as you continue to meet the criteria for the program. (Age 55-77, Current smoker or smoker who has quit within the last 15 years). °If you are a smoker, remember, quitting is the single most powerful action that you can take to decrease your risk of lung cancer and other pulmonary, breathing related problems. °We know quitting is hard, and we are here to help.  °Please let us know if there is anything we can do to help you meet your goal of quitting. °If you are a former smoker, congratulations. We are proud of you! Remain smoke free! °Remember you can refer friends or family members through the number above.  °We will screen them to make sure they meet criteria for the program. °Thank you for helping us take better care of you by participating in Lung Screening. ° °You can receive free nicotine replacement therapy  ( patches, gum or mints) by calling 1-800-QUIT NOW. Please call so we can get you on the path to becoming  a non-smoker. I know it is hard, but you can do this! ° °Lung RADS Categories: ° °Lung RADS 1: no nodules or definitely non-concerning nodules.  °Recommendation is for a repeat annual scan in 12 months. ° °Lung RADS 2:  nodules that are non-concerning in appearance and behavior with a very low likelihood of becoming an active cancer. °Recommendation is for a repeat annual scan in 12 months. ° °Lung RADS 3: nodules that are probably non-concerning , includes nodules with a low likelihood of becoming an active cancer.  Recommendation is for a 6-month repeat screening scan. Often noted after an upper respiratory illness. We will be in touch to make sure you have no questions, and to schedule your 6-month scan. ° °Lung RADS 4 A: nodules with concerning findings, recommendation is most often for a follow up scan in 3 months or additional testing based on our provider's assessment of the scan. We will be in touch to make sure you have no questions and to schedule the recommended 3 month follow up scan. ° °Lung RADS 4 B:  indicates findings that are concerning. We will be in touch with you to schedule additional diagnostic testing based on our provider's  assessment of the scan. ° °Hypnosis for smoking cessation  °[employer] °[phone] ° °Acupuncture for smoking cessation  °[facility] °[phone]  °

## 2021-04-16 DIAGNOSIS — M5136 Other intervertebral disc degeneration, lumbar region: Secondary | ICD-10-CM | POA: Diagnosis not present

## 2021-04-16 DIAGNOSIS — M47816 Spondylosis without myelopathy or radiculopathy, lumbar region: Secondary | ICD-10-CM | POA: Diagnosis not present

## 2021-04-16 DIAGNOSIS — M5416 Radiculopathy, lumbar region: Secondary | ICD-10-CM | POA: Diagnosis not present

## 2021-04-16 DIAGNOSIS — M961 Postlaminectomy syndrome, not elsewhere classified: Secondary | ICD-10-CM | POA: Diagnosis not present

## 2021-04-16 DIAGNOSIS — Z5181 Encounter for therapeutic drug level monitoring: Secondary | ICD-10-CM | POA: Diagnosis not present

## 2021-04-16 DIAGNOSIS — M5442 Lumbago with sciatica, left side: Secondary | ICD-10-CM | POA: Diagnosis not present

## 2021-04-16 DIAGNOSIS — M5417 Radiculopathy, lumbosacral region: Secondary | ICD-10-CM | POA: Diagnosis not present

## 2021-04-16 DIAGNOSIS — Z79899 Other long term (current) drug therapy: Secondary | ICD-10-CM | POA: Diagnosis not present

## 2021-04-16 DIAGNOSIS — M549 Dorsalgia, unspecified: Secondary | ICD-10-CM | POA: Diagnosis not present

## 2021-04-16 DIAGNOSIS — G8929 Other chronic pain: Secondary | ICD-10-CM | POA: Diagnosis not present

## 2021-04-17 ENCOUNTER — Ambulatory Visit (INDEPENDENT_AMBULATORY_CARE_PROVIDER_SITE_OTHER): Payer: Medicare Other

## 2021-04-17 ENCOUNTER — Other Ambulatory Visit: Payer: Self-pay

## 2021-04-17 ENCOUNTER — Ambulatory Visit: Payer: Medicare Other

## 2021-04-17 DIAGNOSIS — I700 Atherosclerosis of aorta: Secondary | ICD-10-CM | POA: Diagnosis not present

## 2021-04-17 DIAGNOSIS — J349 Unspecified disorder of nose and nasal sinuses: Secondary | ICD-10-CM | POA: Diagnosis not present

## 2021-04-17 DIAGNOSIS — Z87891 Personal history of nicotine dependence: Secondary | ICD-10-CM

## 2021-04-17 DIAGNOSIS — I2584 Coronary atherosclerosis due to calcified coronary lesion: Secondary | ICD-10-CM | POA: Diagnosis not present

## 2021-04-17 DIAGNOSIS — F17200 Nicotine dependence, unspecified, uncomplicated: Secondary | ICD-10-CM

## 2021-04-17 DIAGNOSIS — Z122 Encounter for screening for malignant neoplasm of respiratory organs: Secondary | ICD-10-CM

## 2021-04-17 DIAGNOSIS — F17210 Nicotine dependence, cigarettes, uncomplicated: Secondary | ICD-10-CM | POA: Diagnosis not present

## 2021-04-19 ENCOUNTER — Other Ambulatory Visit: Payer: Self-pay

## 2021-04-19 ENCOUNTER — Encounter: Payer: Self-pay | Admitting: Family Medicine

## 2021-04-19 DIAGNOSIS — Z87891 Personal history of nicotine dependence: Secondary | ICD-10-CM

## 2021-04-19 DIAGNOSIS — I700 Atherosclerosis of aorta: Secondary | ICD-10-CM | POA: Insufficient documentation

## 2021-04-19 DIAGNOSIS — J439 Emphysema, unspecified: Secondary | ICD-10-CM | POA: Insufficient documentation

## 2021-04-19 DIAGNOSIS — F17210 Nicotine dependence, cigarettes, uncomplicated: Secondary | ICD-10-CM

## 2021-04-19 NOTE — Progress Notes (Signed)
ct 

## 2021-04-30 DIAGNOSIS — K219 Gastro-esophageal reflux disease without esophagitis: Secondary | ICD-10-CM | POA: Diagnosis not present

## 2021-04-30 DIAGNOSIS — K580 Irritable bowel syndrome with diarrhea: Secondary | ICD-10-CM | POA: Diagnosis not present

## 2021-04-30 DIAGNOSIS — Z86010 Personal history of colonic polyps: Secondary | ICD-10-CM | POA: Diagnosis not present

## 2021-04-30 DIAGNOSIS — Z9049 Acquired absence of other specified parts of digestive tract: Secondary | ICD-10-CM | POA: Diagnosis not present

## 2021-04-30 DIAGNOSIS — R1033 Periumbilical pain: Secondary | ICD-10-CM | POA: Diagnosis not present

## 2021-06-12 DIAGNOSIS — G8929 Other chronic pain: Secondary | ICD-10-CM | POA: Diagnosis not present

## 2021-06-12 DIAGNOSIS — M47816 Spondylosis without myelopathy or radiculopathy, lumbar region: Secondary | ICD-10-CM | POA: Diagnosis not present

## 2021-06-12 DIAGNOSIS — M5442 Lumbago with sciatica, left side: Secondary | ICD-10-CM | POA: Diagnosis not present

## 2021-06-12 DIAGNOSIS — M5136 Other intervertebral disc degeneration, lumbar region: Secondary | ICD-10-CM | POA: Diagnosis not present

## 2021-06-12 DIAGNOSIS — M5417 Radiculopathy, lumbosacral region: Secondary | ICD-10-CM | POA: Diagnosis not present

## 2021-06-12 DIAGNOSIS — M549 Dorsalgia, unspecified: Secondary | ICD-10-CM | POA: Diagnosis not present

## 2021-06-12 DIAGNOSIS — M961 Postlaminectomy syndrome, not elsewhere classified: Secondary | ICD-10-CM | POA: Diagnosis not present

## 2021-07-16 DIAGNOSIS — M5416 Radiculopathy, lumbar region: Secondary | ICD-10-CM | POA: Diagnosis not present

## 2021-07-16 DIAGNOSIS — G8929 Other chronic pain: Secondary | ICD-10-CM | POA: Diagnosis not present

## 2021-07-16 DIAGNOSIS — M5136 Other intervertebral disc degeneration, lumbar region: Secondary | ICD-10-CM | POA: Diagnosis not present

## 2021-07-16 DIAGNOSIS — M5442 Lumbago with sciatica, left side: Secondary | ICD-10-CM | POA: Diagnosis not present

## 2021-07-23 DIAGNOSIS — Z20822 Contact with and (suspected) exposure to covid-19: Secondary | ICD-10-CM | POA: Diagnosis not present

## 2021-08-10 DIAGNOSIS — M545 Low back pain, unspecified: Secondary | ICD-10-CM | POA: Diagnosis not present

## 2021-08-10 DIAGNOSIS — R1084 Generalized abdominal pain: Secondary | ICD-10-CM | POA: Diagnosis not present

## 2021-08-10 DIAGNOSIS — F17290 Nicotine dependence, other tobacco product, uncomplicated: Secondary | ICD-10-CM | POA: Diagnosis not present

## 2021-08-10 DIAGNOSIS — E278 Other specified disorders of adrenal gland: Secondary | ICD-10-CM | POA: Diagnosis not present

## 2021-08-10 DIAGNOSIS — K769 Liver disease, unspecified: Secondary | ICD-10-CM | POA: Diagnosis not present

## 2021-08-10 DIAGNOSIS — N400 Enlarged prostate without lower urinary tract symptoms: Secondary | ICD-10-CM | POA: Diagnosis not present

## 2021-08-10 DIAGNOSIS — R109 Unspecified abdominal pain: Secondary | ICD-10-CM | POA: Diagnosis not present

## 2021-08-10 DIAGNOSIS — R112 Nausea with vomiting, unspecified: Secondary | ICD-10-CM | POA: Diagnosis not present

## 2021-08-10 DIAGNOSIS — N281 Cyst of kidney, acquired: Secondary | ICD-10-CM | POA: Diagnosis not present

## 2021-08-15 DIAGNOSIS — M5416 Radiculopathy, lumbar region: Secondary | ICD-10-CM | POA: Diagnosis not present

## 2021-08-15 DIAGNOSIS — M961 Postlaminectomy syndrome, not elsewhere classified: Secondary | ICD-10-CM | POA: Diagnosis not present

## 2021-08-15 DIAGNOSIS — M5136 Other intervertebral disc degeneration, lumbar region: Secondary | ICD-10-CM | POA: Diagnosis not present

## 2021-08-19 DIAGNOSIS — M5416 Radiculopathy, lumbar region: Secondary | ICD-10-CM | POA: Diagnosis not present

## 2021-08-26 ENCOUNTER — Telehealth: Payer: Self-pay | Admitting: Family Medicine

## 2021-08-26 NOTE — Telephone Encounter (Signed)
Pls call for more detail

## 2021-08-26 NOTE — Telephone Encounter (Signed)
Pt called. He wants to be referred to a neurosurgeon for his ongoing pain. He said he has spoken to Dr Madilyn Fireman about his pain.

## 2021-08-26 NOTE — Telephone Encounter (Signed)
Patient scheduled.

## 2021-08-27 ENCOUNTER — Encounter: Payer: Self-pay | Admitting: Family Medicine

## 2021-08-27 ENCOUNTER — Ambulatory Visit (INDEPENDENT_AMBULATORY_CARE_PROVIDER_SITE_OTHER): Payer: Medicare Other | Admitting: Family Medicine

## 2021-08-27 VITALS — BP 164/93 | HR 83 | Resp 18 | Ht 74.0 in | Wt 228.0 lb

## 2021-08-27 DIAGNOSIS — M5416 Radiculopathy, lumbar region: Secondary | ICD-10-CM

## 2021-08-27 DIAGNOSIS — E785 Hyperlipidemia, unspecified: Secondary | ICD-10-CM

## 2021-08-27 DIAGNOSIS — R7309 Other abnormal glucose: Secondary | ICD-10-CM

## 2021-08-27 DIAGNOSIS — I10 Essential (primary) hypertension: Secondary | ICD-10-CM

## 2021-08-27 DIAGNOSIS — J439 Emphysema, unspecified: Secondary | ICD-10-CM | POA: Diagnosis not present

## 2021-08-27 DIAGNOSIS — M5136 Other intervertebral disc degeneration, lumbar region: Secondary | ICD-10-CM

## 2021-08-27 DIAGNOSIS — Z125 Encounter for screening for malignant neoplasm of prostate: Secondary | ICD-10-CM | POA: Diagnosis not present

## 2021-08-27 NOTE — Assessment & Plan Note (Signed)
Due for updated lipid panel today.  He is fasting.

## 2021-08-27 NOTE — Assessment & Plan Note (Signed)
Currently followed at [facility] but would like to be referred somewhere else.  Het feels that he is not being heard.  Though he did recently just received an epidural.  And they did recently switch him back to Percocet.  Was able to find last MRI in care everywhere from September 2022 showing posterior decompression previously performed at L3-S1.  There is some lumbar spondylosis without significant stenosis.  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  But otherwise no significant interval change from August 2020.

## 2021-08-27 NOTE — Progress Notes (Unsigned)
Established Patient Office Visit  Subjective   Patient ID: John Scott, male    DOB: May 12, 1950  Age: 71 y.o. MRN: [id number]  Chief Complaint  Patient presents with   Referral    Neurosurgeon and Pain Management.    Postnasal drainage     Productive cough for several years. Patient currently taking Sudafed to help with cough    Cold Sweats    1-2 weeks    Nevus    Right arm, itches     HPI  Chronic cough - Productive cough for several years. Patient currently taking Sudafed to help with cough.  He says at least a couple of times a week he will experience some increased shortness of breath.  He does not have a home pulse oximeter.  He also continues to have chronic postnasal drip.  He is also been on a nasal steroid spray as well as ipratropium nasal spray and that does seem to help but sometimes he still continues to have a lot of drainage and drip especially at night.  He has never really had allergy testing or testing for dust mites.  He does get a yearly CT screening for lung cancer.  Neurosurgeon and Pain Management -currently followed by pain management for chronic back pain.  He was on Nucynta which is extremely expensive.  It looks like they recently switched him back to [facility] 10/325.  He says that he is not looking for more pain medication he just feels like he is not being heard.  He just had an epidural last week.  Would also like a refill on his 80 mg ibuprofen.  Last serum creatinine on file was from May 2023 at 1.1.  He still continues to struggle with frequent diarrhea and intermittent vomiting.  He has consulted with GI multiple times but it still occurs.  {History (Optional):23778}  ROS    Objective:     BP (!) 164/93   Pulse 83   Resp 18   Ht '6\' 2"'$  (1.88 m)   Wt 228 lb (103.4 kg)   SpO2 95%   BMI 29.27 kg/m  {Vitals History (Optional):23777}  Physical Exam Constitutional:      Appearance: He is well-developed.  HENT:     Head:  Normocephalic and atraumatic.  Cardiovascular:     Rate and Rhythm: Normal rate and regular rhythm.     Heart sounds: Normal heart sounds.  Pulmonary:     Effort: Pulmonary effort is normal.     Breath sounds: Normal breath sounds.  Skin:    General: Skin is warm and dry.  Neurological:     Mental Status: He is alert and oriented to person, place, and time.  Psychiatric:        Behavior: Behavior normal.      No results found for any visits on 08/27/21.  {Labs (Optional):23779}  The 10-year ASCVD risk score (Arnett DK, et al., 2019) is: 32.3%* (Cholesterol units were assumed)    Assessment & Plan:   Problem List Items Addressed This Visit       Cardiovascular and Mediastinum   Essential hypertension (Chronic)    Blood pressure uncontrolled today but he admits that he does not take his medications daily he just takes it a couple of times a week.  Encouraged him to take it very regularly.      Relevant Orders   Lipid Panel w/reflex Direct LDL   COMPLETE METABOLIC PANEL WITH GFR   CBC   Hemoglobin  A1c     Nervous and Auditory   Lumbar radiculopathy - Primary (Chronic)    Currently followed at [facility] but would like to be referred somewhere else.  Het feels that he is not being heard.  Though he did recently just received an epidural.  And they did recently switch him back to Percocet.      Relevant Orders   Ambulatory referral to Pain Clinic     Musculoskeletal and Integument   Degeneration of lumbar intervertebral disc   Relevant Medications   oxyCODONE-acetaminophen (PERCOCET) 10-325 MG tablet   Other Relevant Orders   Ambulatory referral to Pain Clinic     Other   Hyperlipidemia    Due for updated lipid panel today.  He is fasting.      Relevant Orders   Lipid Panel w/reflex Direct LDL   Other Visit Diagnoses     Screening for prostate cancer       Abnormal glucose       Relevant Orders   Hemoglobin A1c       Return in about 4 weeks  (around 09/24/2021) for Spirometry.Beatrice Lecher, MD

## 2021-08-27 NOTE — Assessment & Plan Note (Signed)
Blood pressure uncontrolled today but he admits that he does not take his medications daily he just takes it a couple of times a week.  Encouraged him to take it very regularly.

## 2021-08-28 LAB — COMPLETE METABOLIC PANEL WITH GFR
AG Ratio: 1.6 (calc) (ref 1.0–2.5)
ALT: 17 U/L (ref 9–46)
AST: 16 U/L (ref 10–35)
Albumin: 4.4 g/dL (ref 3.6–5.1)
Alkaline phosphatase (APISO): 101 U/L (ref 35–144)
BUN: 16 mg/dL (ref 7–25)
CO2: 24 mmol/L (ref 20–32)
Calcium: 9.4 mg/dL (ref 8.6–10.3)
Chloride: 103 mmol/L (ref 98–110)
Creat: 1.01 mg/dL (ref 0.70–1.28)
Globulin: 2.7 g/dL (calc) (ref 1.9–3.7)
Glucose, Bld: 96 mg/dL (ref 65–99)
Potassium: 4.7 mmol/L (ref 3.5–5.3)
Sodium: 139 mmol/L (ref 135–146)
Total Bilirubin: 0.5 mg/dL (ref 0.2–1.2)
Total Protein: 7.1 g/dL (ref 6.1–8.1)
eGFR: 80 mL/min/{1.73_m2} (ref >=60)

## 2021-08-28 LAB — CBC
HCT: 49.6 % (ref 38.5–50.0)
Hemoglobin: 16.8 g/dL (ref 13.2–17.1)
MCH: 31.6 pg (ref 27.0–33.0)
MCHC: 33.9 g/dL (ref 32.0–36.0)
MCV: 93.4 fL (ref 80.0–100.0)
MPV: 9.2 fL (ref 7.5–12.5)
Platelets: 333 10*3/uL (ref 140–400)
RBC: 5.31 10*6/uL (ref 4.20–5.80)
RDW: 12.7 % (ref 11.0–15.0)
WBC: 8.6 10*3/uL (ref 3.8–10.8)

## 2021-08-28 LAB — HEMOGLOBIN A1C
Hgb A1c MFr Bld: 5.2 % of total Hgb (ref <5.7)
Mean Plasma Glucose: 103 mg/dL
eAG (mmol/L): 5.7 mmol/L

## 2021-08-28 LAB — LIPID PANEL W/REFLEX DIRECT LDL
Cholesterol: 202 mg/dL — ABNORMAL HIGH (ref <200)
HDL: 78 mg/dL (ref >=40)
LDL Cholesterol (Calc): 108 mg/dL (calc) — ABNORMAL HIGH
Non-HDL Cholesterol (Calc): 124 mg/dL (calc) (ref <130)
Total CHOL/HDL Ratio: 2.6 (calc) (ref <5.0)
Triglycerides: 69 mg/dL (ref <150)

## 2021-08-28 MED ORDER — IBUPROFEN 800 MG PO TABS: 800.0000 mg | ORAL_TABLET | Freq: Three times a day (TID) | ORAL | 1 refills | Status: DC | PRN

## 2021-08-28 NOTE — Assessment & Plan Note (Signed)
It sounds like he is getting some yearly CT lung screenings.  I do not have copies of those reports but based on his description it sounds like that is what is having done.  Because of the increased shortness of breath we discussed scheduling him for spirometry here in the office and if needed further evaluation with PFTs.

## 2021-08-28 NOTE — Addendum Note (Signed)
Addended by: Beatrice Lecher D on: 08/28/2021 10:34 AM   Modules accepted: Level of Service

## 2021-08-28 NOTE — Progress Notes (Signed)
Call patient: Cholesterol looks much better this year compared to 2 years ago.  Great work and bringing that down.  Metabolic panel including liver and kidney is looks good.  Normal blood count.  No anemia.  No sign of diabetes or prediabetes.

## 2021-09-08 ENCOUNTER — Other Ambulatory Visit: Payer: Self-pay | Admitting: Family Medicine

## 2021-09-08 DIAGNOSIS — I10 Essential (primary) hypertension: Secondary | ICD-10-CM

## 2021-09-19 DIAGNOSIS — M5416 Radiculopathy, lumbar region: Secondary | ICD-10-CM | POA: Diagnosis not present

## 2021-09-19 DIAGNOSIS — Z5181 Encounter for therapeutic drug level monitoring: Secondary | ICD-10-CM | POA: Diagnosis not present

## 2021-09-19 DIAGNOSIS — Z79899 Other long term (current) drug therapy: Secondary | ICD-10-CM | POA: Diagnosis not present

## 2021-09-19 DIAGNOSIS — M961 Postlaminectomy syndrome, not elsewhere classified: Secondary | ICD-10-CM | POA: Diagnosis not present

## 2021-10-24 ENCOUNTER — Telehealth: Payer: Medicare Other

## 2021-10-25 ENCOUNTER — Telehealth: Payer: Medicare Other

## 2021-10-25 NOTE — Progress Notes (Deleted)
Chronic Care Management Pharmacy Note  10/25/2021 Name:  John Scott MRN:  [id number] DOB:  02/18/1951  Summary: addressed HTN, HLD. During medication review, patient was noted to be taking both omeprazole and pantoprazole.   Recommendations/Changes made from today's visit: Recommended discontinue pantoprazole to avoid therapeutic duplication.  Recommended repeat lipid panel at next PCP visit, consider statin if LDL >100, as ASCVD risk is 31%  Plan: f/u with pharmacist in 1 yr  Subjective: John Scott is an 71 y.o. year old male who is a primary patient of Metheney, Rene Kocher, MD.  The CCM team was consulted for assistance with disease management and care coordination needs.    Engaged with patient by telephone for initial visit in response to provider referral for pharmacy case management and/or care coordination services.   Consent to Services:  The patient was given information about Chronic Care Management services, agreed to services, and gave verbal consent prior to initiation of services.  Please see initial visit note for detailed documentation.   Patient Care Team: Hali Marry, MD as PCP - General (Family Medicine) Lennie Odor as Referring Physician (Pain Medicine) Darius Bump, [facility] as Pharmacist (Pharmacist)  Recent office visits:  No visits noted   Recent consult visits:  10/04/20- Lind Covert, NP (Pain Medicine)- seen for follow up of back pain, increased norco 10/325 mg to four times daily prn, discontinued nucynta er due to cost, follow up 8 weeks 09/18/20- Left S1 Transforaminal Epidural Steroid Injection for back/leg pain by Wylene Men, MD at pain medicine 08/09/20-  Lind Covert, NP (Pain Medicine)- seen for follow up of chronic pain, no medication changes, follow up 8 weeks 04/25/20- Lorne Skeens, MD ( Gastroenterology)- seen for initial consult for diarrhea, EGD/ colonoscopy scheduled patient to be admitted for 2 day bowel  preparation, no medication changes   Hospital visits:   Admitted to the hospital on 07/18/20 due to diarrhea/ EGD. Discharge date was 07/20/20. Dis[facility]     New?Medications Started at [facility] Discharge:?? Pantoprazole 40 mg one tab daily    Medications Discontinued at Hospital Discharge: Glycopyrrolate 2 mg    All other medications  remain the same after Hospital Discharge.  Objective:  Lab Results  Component Value Date   CREATININE 1.01 08/27/2021   CREATININE 0.96 12/21/2020   CREATININE 0.78 08/31/2019    Lab Results  Component Value Date   HGBA1C 5.2 08/27/2021   Last diabetic Eye exam: No results found for: "HMDIABEYEEXA"  Last diabetic Foot exam: No results found for: "HMDIABFOOTEX"      Component Value Date/Time   CHOL 202 (H) 08/27/2021 0000   TRIG 69 08/27/2021 0000   HDL 78 08/27/2021 0000   CHOLHDL 2.6 08/27/2021 0000   VLDL NOT CALC 06/11/2011 1130   LDLCALC 108 (H) 08/27/2021 0000       Latest Ref Rng & Units 08/27/2021   12:00 AM 12/21/2020   10:28 AM 02/18/2019    1:39 PM  Hepatic Function  Total Protein 6.1 - 8.1 g/dL 7.1  6.3  6.4   AST 10 - 35 U/L _0 ALT 9 - 46 U/L _1 Total Bilirubin 0.2 - 1.2 mg/dL 0.5  0.7  0.4     Lab Results  Component Value Date/Time   TSH 2.361 12/17/2007 04:14 AM   TSH 2.6 09/22/2006 12:00 AM       Latest  Ref Rng & Units 08/27/2021   12:00 AM 12/21/2020   10:28 AM 02/18/2019    1:39 PM  CBC  WBC 3.8 - 10.8 Thousand/uL 8.6  10.5  5.5   Hemoglobin 13.2 - 17.1 g/dL 16.8  16.9  15.0   Hematocrit 38.5 - 50.0 % 49.6  49.5  44.8   Platelets 140 - 400 Thousand/uL 333  294  229      Clinical ASCVD: Yes  The 10-year ASCVD risk score (Arnett DK, et al., 2019) is: 25.7%   Values used to calculate the score:     Age: 71 years     Sex: Male     Is Non-Hispanic African American: No     Diabetic: No     Tobacco smoker: No     Systolic Blood Pressure: 277  mmHg     Is BP treated: Yes     HDL Cholesterol: 78 mg/dL     Total Cholesterol: 202 mg/dL     Social History   Tobacco Use  Smoking Status Former   Packs/day: 1.00   Years: 39.00   Total pack years: 39.00   Types: Cigars, Cigarettes  Smokeless Tobacco Never  Tobacco Comments   Quit cigarettes 2012, still smokes tobacco cigars   BP Readings from Last 3 Encounters:  08/27/21 (!) 164/93  01/10/21 121/81  12/13/20 (!) 143/85   Pulse Readings from Last 3 Encounters:  08/27/21 83  01/10/21 89  12/13/20 73   Wt Readings from Last 3 Encounters:  08/27/21 228 lb (103.4 kg)  01/10/21 229 lb (103.9 kg)  12/13/20 226 lb 0.6 oz (102.5 kg)    Assessment: Review of patient past medical history, allergies, medications, health status, including review of consultants reports, laboratory and other test data, was performed as part of comprehensive evaluation and provision of chronic care management services.   SDOH:  (Social Determinants of Health) assessments and interventions performed:    CCM Care Plan  No Known Allergies  Medications Reviewed Today     Reviewed by Hali Marry, MD (Physician) on 08/27/21 at 1403  Med List Status: <None>   Medication Order Taking? Sig Documenting Provider Last Dose Status Informant  AMBULATORY NON FORMULARY MEDICATION [id number] Yes Cane for ambulation, heavy duty Silverio Decamp, MD Taking Active   amLODipine (NORVASC) 5 MG tablet [id number] Yes Take 1 tablet (5 mg total) by mouth daily. Terrilyn Saver, NP Taking Active   baclofen (LIORESAL) 10 MG tablet [id number] Yes Take 10 mg by mouth daily as needed for muscle spasms. [provider] Taking Active   Cholecalciferol 50 MCG (2000 UT) TABS [id number] Yes Take by mouth. [provider] Taking Active   dicyclomine (BENTYL) 20 MG tablet [id number] Yes Take 1 tablet (20 mg total) by mouth 3 (three) times daily before meals. Terrilyn Saver, NP Taking Active    diphenhydrAMINE (BENADRYL) 25 MG tablet [id number] Yes Take 1 tablet by mouth every 4 (four) hours as needed. [provider] Taking Active   diphenoxylate-atropine (LOMOTIL) 2.5-0.025 MG tablet [id number] Yes Take 1-2 tablets by mouth 4 (four) times daily as needed for diarrhea or loose stools. Hali Marry, MD Taking Active   docusate sodium (COLACE) 100 MG capsule [id number] Yes Take 100 mg by mouth as needed. [provider] Taking Active   fexofenadine (ALLEGRA) 60 MG tablet [id number] Yes Take 1 tablet by mouth daily. [provider] Taking Active   gabapentin (NEURONTIN) 600 MG tablet [id number]  Take 2  tablets (1,200 mg total) by mouth 3 (three) times daily. Hali Marry, MD  Expired 10/02/20 2359   ibuprofen (ADVIL) 800 MG tablet [id number] Yes Take 1 tablet by mouth 3 (three) times daily. [provider] Taking Active   ipratropium (ATROVENT) 0.03 % nasal spray [id number] Yes Place 2 sprays into both nostrils every 12 (twelve) hours. Hali Marry, MD Taking Active   lidocaine (LIDODERM) 5 % [id number] Yes Place 1 patch onto the skin daily. Remove & Discard patch within 12 hours or as directed by MD [provider] Taking Active   NARCAN 4 MG/0.1ML LIQD nasal spray kit [id number] Yes ONE SPRAY BY NASAL ROUTE ONCE AS NEEDED FOR UP TO 1 DOSE. [provider] Taking Active   omeprazole (PRILOSEC) 40 MG capsule [id number] Yes Take 40 mg by mouth 2 (two) times daily. Gerre Pebbles, MD Taking Active   oxyCODONE-acetaminophen (PERCOCET) 10-325 MG tablet [id number] Yes Take by mouth. [provider] Taking Active   Sennosides 25 MG TABS [id number] Yes Take by mouth 3 (three) times daily. [provider] Taking Active   triprolidine-pseudoephedrine (APRODINE) 2.5-60 MG TABS tablet [id number] Yes Take 1 tablet by mouth every 6 (six) hours as needed. 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   Fibromyalgia 05/03/2018   Degeneration of lumbar intervertebral disc 05/03/2018   Long term (current) use of opiate analgesic 05/03/2018   Myofascial pain 04/30/2016   Pressure sore 02/29/2016   Onychodystrophy 02/29/2016   Idiopathic peripheral neuropathy 03/23/2015   Essential hypertension 10/11/2014   Lumbar radiculopathy 05/22/2014   Left leg pain 12/12/2011   Left leg weakness 12/12/2011   Functional diarrhea 06/10/2010   Hyperlipidemia 12/16/2007   SOB 10/07/2007   Postlaminectomy syndrome 09/17/2007   BENIGN PROSTATIC HYPERTROPHY, WITH URINARY OBSTRUCTION 06/28/2007   CHRONIC RHINITIS 05/27/2007   LEG EDEMA 05/27/2007   MDD (major depressive disorder), recurrent episode ([place]) 02/10/2007   SPONDYLOSIS, LUMBOSACRAL 02/10/2007   Pain management 02/10/2007    Immunization History  Administered Date(s) Administered   Fluad Quad(high Dose 65+) 11/02/2018, 12/08/2019   H1N1 02/24/2008   Influenza Split 11/12/2011   Influenza Whole 12/16/2007, 12/08/2008, 11/11/2012   Influenza, High Dose Seasonal PF 01/15/2017, 02/01/2018   Influenza-Unspecified 12/16/2014   Moderna SARS-COV2 Booster Vaccination 05/24/2020   Moderna Sars-Covid-2 Vaccination 03/14/2020   Pneumococcal Conjugate-13 05/13/2016   Pneumococcal Polysaccharide-23 12/16/2007, 05/03/2018   Td 09/14/2003, 01/14/2004, 12/16/2007   Tdap 02/01/2018   Tetanus 08/16/1994   Zoster Recombinat (Shingrix) 10/02/2017, 02/01/2018    Conditions to be addressed/monitored: HTN and HLD  There are no care plans that you recently modified to  display for this patient.   Medication Assistance: None required.  Patient affirms current coverage meets needs.  Patient's preferred pharmacy is:  [place], [place] - [address] [place] [address] Phone: [phone] Fax: [phone]  [id number] [place] [address]: [phone]: [phone] Uses pill box? No - keeps  medication in pill bottles in a basket, working well for him  Pt endorses 100% compliance  Follow Up:  Patient agrees to Care Plan and Follow-up.  Plan: Telephone follow up appointment with care management team member scheduled for:  1 year  Darius Bump

## 2021-12-09 DIAGNOSIS — M5417 Radiculopathy, lumbosacral region: Secondary | ICD-10-CM | POA: Diagnosis not present

## 2022-01-17 ENCOUNTER — Ambulatory Visit (INDEPENDENT_AMBULATORY_CARE_PROVIDER_SITE_OTHER): Payer: Medicare Other | Admitting: Family Medicine

## 2022-01-17 ENCOUNTER — Encounter: Payer: Self-pay | Admitting: Family Medicine

## 2022-01-17 VITALS — BP 139/86 | HR 82 | Ht 74.0 in | Wt 242.0 lb

## 2022-01-17 DIAGNOSIS — G5700 Lesion of sciatic nerve, unspecified lower limb: Secondary | ICD-10-CM | POA: Diagnosis not present

## 2022-01-17 DIAGNOSIS — R1032 Left lower quadrant pain: Secondary | ICD-10-CM

## 2022-01-17 DIAGNOSIS — J439 Emphysema, unspecified: Secondary | ICD-10-CM | POA: Diagnosis not present

## 2022-01-17 DIAGNOSIS — K5792 Diverticulitis of intestine, part unspecified, without perforation or abscess without bleeding: Secondary | ICD-10-CM | POA: Diagnosis not present

## 2022-01-17 DIAGNOSIS — Z23 Encounter for immunization: Secondary | ICD-10-CM | POA: Diagnosis not present

## 2022-01-17 MED ORDER — AMOXICILLIN-POT CLAVULANATE 875-125 MG PO TABS: 1.0000 | ORAL_TABLET | Freq: Two times a day (BID) | ORAL | 0 refills | Status: DC

## 2022-01-17 MED ORDER — TRELEGY ELLIPTA 100-62.5-25 MCG/ACT IN AEPB: 1.0000 | INHALATION_SPRAY | Freq: Every day | RESPIRATORY_TRACT | 11 refills | Status: DC

## 2022-01-17 NOTE — Assessment & Plan Note (Addendum)
-   pt says pain has been present for 3 years. He is due for a colonoscopy so I have sent a referral over to GI. I also think pt would benefit from a discussion with GI about abdominal pain and bloating. Recommended BRAT diet - on exam pt had exquisite LLQ tenderness and based on previous epic chart imaging of his abdomen he does have colitis in that area. Will go ahead and treat for suspected diverticulitis with augmentin.

## 2022-01-17 NOTE — Assessment & Plan Note (Signed)
-   at the end of the exam, pt notes sciatic neuropathy as well as lumbar radiculopathy. He is followed by the [facility] for this but believes the pain medication he has been given is not working anymore. He would like to discuss epidural injection options. Have referred him to Dr. Darene Lamer to get set up and see if he is a candidate for injection.

## 2022-01-17 NOTE — Patient Instructions (Addendum)
BRAT Diet - Bananas  - Rice  - Apple sauce  - Toast   Increase water intake and fiber   Take antibiotic

## 2022-01-17 NOTE — Assessment & Plan Note (Signed)
-   started pt on trelegy to see if we can get him better with his chronic cough

## 2022-01-17 NOTE — Progress Notes (Signed)
Acute Office Visit  Subjective:     Patient ID: John Scott, male    DOB: 12-08-1950, 71 y.o.   MRN: [id number]  Chief Complaint  Patient presents with   GI Problem    GI Problem The primary symptoms include abdominal pain. Primary symptoms do not include fever.  The illness does not include chills.   Patient is in today for lower abdominal pain and bloating. He has been dealing with this issue for 3 years. He has a history of appendectomy and gallbladder removal. He had a colonoscopy done a while ago. He is unable to say which foods help. He is on bentyl and does well with this. He says as of 10 days ago he will feel bloated. He stopped taking his omeprazole about a year ago. Denies fever, chills.    He is noting depressive symptoms and says the duloxetine.   Pt has a hx of emphysema and is not on an inhaler. He notes a chronic cough.   Review of Systems  Constitutional:  Negative for chills and fever.  Respiratory:  Negative for cough and shortness of breath.   Cardiovascular:  Negative for chest pain.  Gastrointestinal:  Positive for abdominal pain.  Neurological:  Negative for headaches.       Objective:    BP 139/86   Pulse 82   Ht '6\' 2"'$  (1.88 m)   Wt 242 lb (109.8 kg)   SpO2 97%   BMI 31.07 kg/m    Physical Exam Vitals and nursing note reviewed.  Constitutional:      General: He is not in acute distress.    Appearance: Normal appearance.  HENT:     Head: Normocephalic and atraumatic.     Right Ear: External ear normal.     Left Ear: External ear normal.     Nose: Nose normal.  Eyes:     Conjunctiva/sclera: Conjunctivae normal.  Cardiovascular:     Rate and Rhythm: Normal rate and regular rhythm.  Pulmonary:     Effort: Pulmonary effort is normal.     Breath sounds: Normal breath sounds.  Abdominal:     General: Abdomen is flat. Bowel sounds are normal.     Tenderness: There is no abdominal tenderness.  Neurological:     General: No focal  deficit present.     Mental Status: He is alert and oriented to person, place, and time.  Psychiatric:        Mood and Affect: Mood normal.        Behavior: Behavior normal.        Thought Content: Thought content normal.        Judgment: Judgment normal.     No results found for any visits on 01/17/22.      Assessment & Plan:   Problem List Items Addressed This Visit       Respiratory   Emphysema lung ([place])    - started pt on trelegy to see if we can get him better with his chronic cough      Relevant Medications   Fluticasone-Umeclidin-Vilant (TRELEGY ELLIPTA) 100-62.5-25 MCG/ACT AEPB     Nervous and Auditory   Sciatic neuropathy    - at the end of the exam, pt notes sciatic neuropathy as well as lumbar radiculopathy. He is followed by the [facility] for this but believes the pain medication he has been given is not working anymore. He would like to discuss epidural injection options. Have referred him to Dr.  T to get set up and see if he is a candidate for injection.        Other   Left lower quadrant abdominal pain    - pt says pain has been present for 3 years. He is due for a colonoscopy so I have sent a referral over to GI. I also think pt would benefit from a discussion with GI about abdominal pain and bloating. Recommended BRAT diet - on exam pt had exquisite LLQ tenderness and based on previous epic chart imaging of his abdomen he does have colitis in that area. Will go ahead and treat for suspected diverticulitis with augmentin.       Relevant Orders   CBC   COMPLETE METABOLIC PANEL WITH GFR   Other Visit Diagnoses     Diverticulitis    -  Primary   Relevant Medications   amoxicillin-clavulanate (AUGMENTIN) 875-125 MG tablet   Other Relevant Orders   Ambulatory referral to Gastroenterology       Meds ordered this encounter  Medications   amoxicillin-clavulanate (AUGMENTIN) 875-125 MG tablet    Sig: Take 1 tablet by mouth 2 (two) times daily.    Dispense:   20 tablet    Refill:  0   Fluticasone-Umeclidin-Vilant (TRELEGY ELLIPTA) 100-62.5-25 MCG/ACT AEPB    Sig: Inhale 1 puff into the lungs daily.    Dispense:  1 each    Refill:  11    Return in about 4 weeks (around 02/14/2022).  Owens Loffler, DO

## 2022-01-18 LAB — COMPLETE METABOLIC PANEL WITH GFR
AG Ratio: 1.8 (calc) (ref 1.0–2.5)
ALT: 10 U/L (ref 9–46)
AST: 10 U/L (ref 10–35)
Albumin: 3.8 g/dL (ref 3.6–5.1)
Alkaline phosphatase (APISO): 101 U/L (ref 35–144)
BUN: 14 mg/dL (ref 7–25)
CO2: 28 mmol/L (ref 20–32)
Calcium: 8.8 mg/dL (ref 8.6–10.3)
Chloride: 105 mmol/L (ref 98–110)
Creat: 0.88 mg/dL (ref 0.70–1.28)
Globulin: 2.1 g/dL (calc) (ref 1.9–3.7)
Glucose, Bld: 76 mg/dL (ref 65–99)
Potassium: 4.3 mmol/L (ref 3.5–5.3)
Sodium: 138 mmol/L (ref 135–146)
Total Bilirubin: 0.3 mg/dL (ref 0.2–1.2)
Total Protein: 5.9 g/dL — ABNORMAL LOW (ref 6.1–8.1)
eGFR: 92 mL/min/{1.73_m2} (ref >=60)

## 2022-01-18 LAB — CBC
HCT: 43.5 % (ref 38.5–50.0)
Hemoglobin: 14.4 g/dL (ref 13.2–17.1)
MCH: 31.7 pg (ref 27.0–33.0)
MCHC: 33.1 g/dL (ref 32.0–36.0)
MCV: 95.8 fL (ref 80.0–100.0)
MPV: 9.4 fL (ref 7.5–12.5)
Platelets: 217 10*3/uL (ref 140–400)
RBC: 4.54 10*6/uL (ref 4.20–5.80)
RDW: 11.9 % (ref 11.0–15.0)
WBC: 6.8 10*3/uL (ref 3.8–10.8)

## 2022-01-24 ENCOUNTER — Ambulatory Visit (INDEPENDENT_AMBULATORY_CARE_PROVIDER_SITE_OTHER): Payer: Medicare Other | Admitting: Sports Medicine

## 2022-01-24 DIAGNOSIS — M961 Postlaminectomy syndrome, not elsewhere classified: Secondary | ICD-10-CM | POA: Diagnosis not present

## 2022-01-24 DIAGNOSIS — M5416 Radiculopathy, lumbar region: Secondary | ICD-10-CM

## 2022-01-24 MED ORDER — GABAPENTIN 600 MG PO TABS: 1200.0000 mg | ORAL_TABLET | Freq: Three times a day (TID) | ORAL | 3 refills | Status: DC

## 2022-01-24 MED ORDER — IBUPROFEN 800 MG PO TABS: 800.0000 mg | ORAL_TABLET | Freq: Three times a day (TID) | ORAL | 3 refills | Status: DC | PRN

## 2022-01-24 NOTE — Assessment & Plan Note (Addendum)
[name] returns, he is a 71 year old male, he is status post an injury in the [place], necessitating multilevel laminectomies.  Unfortunately he continues to have pain, weakness in the left leg. 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 There was some mention of potential upper motor neuron lesion on the nerve conduction and EMG but I do not see that he has had thoracic spine imaging, he did have a cervical spine MRI in 2021 that did not explain symptoms. In addition he has not had very much discussion of a spinal cord stimulator trial. He is working with pain management, on Nucynta and oxycodone. He is on submaximal dose gabapentin. He is also on duloxetine per his report, he will get [place] the dosage. I have a couple of ideas although we will not be reinventing the wheel in this case. We will increase his gabapentin to 1200 mg 3 times daily. I would like a second opinion regarding spinal cord stimulator from Dr. Francesco Runner. Considering nerve conduction study evidence of upper motor neuron lesion we will MRI his cervical, thoracic, and lumbar spine, he does need an upright MRI, this can be done at [facility]. I also encouraged him to keep his spine care with 1 provider. Follow-up of his MRIs will really need to be with his other spine providers. He was requesting a neurosurgery consultation, historically he has not had a surgical lesion in his lumbar spine, we will check to see if he has 1 in his cervical or thoracic.

## 2022-01-24 NOTE — Progress Notes (Signed)
    Procedures performed today:    None.  Independent interpretation of notes and tests performed by another provider:   None.  Brief History, Exam, Impression, and Recommendations:    Postlaminectomy syndrome John Scott returns, he is a 71 year old male, he is status post an injury in the [place], necessitating multilevel laminectomies.  Unfortunately he continues to have pain, weakness in the left leg. 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 There was some mention of potential upper motor neuron lesion on the nerve conduction and EMG but I do not see that he has had thoracic spine imaging, he did have a cervical spine MRI in 2021 that did not explain symptoms. In addition he has not had very much discussion of a spinal cord stimulator trial. He is working with pain management, on Nucynta and oxycodone. He is on submaximal dose gabapentin. He is also on duloxetine per his report, he will get [place] the dosage. I have a couple of ideas although we will not be reinventing the wheel in this case. We will increase his gabapentin to 1200 mg 3 times daily. I would like a second opinion regarding spinal cord stimulator from Dr. Francesco Runner. Considering nerve conduction study evidence of upper motor neuron lesion we will MRI his cervical, thoracic, and lumbar spine, he does need an upright MRI, this can be done at [facility]. I also encouraged him to keep his spine care with 1 provider. Follow-up of his MRIs will really need to be with his other spine providers. He was requesting a neurosurgery consultation, historically he has not had a surgical lesion in his lumbar spine, we will check to see if he has 1 in his cervical or thoracic.  I spent 40 minutes of total time managing this patient today, this includes chart review, face to face, and non-face to face  time.  ____________________________________________ Gwen Her. Dianah Field, M.D., ABFM., CAQSM., AME. Primary Care and Sports Medicine [facility]  Adjunct Professor of [place] of [facility] of Medicine  Risk manager

## 2022-02-20 DIAGNOSIS — I10 Essential (primary) hypertension: Secondary | ICD-10-CM | POA: Diagnosis not present

## 2022-02-20 DIAGNOSIS — K219 Gastro-esophageal reflux disease without esophagitis: Secondary | ICD-10-CM | POA: Diagnosis not present

## 2022-02-20 DIAGNOSIS — M5416 Radiculopathy, lumbar region: Secondary | ICD-10-CM | POA: Diagnosis not present

## 2022-02-20 DIAGNOSIS — J449 Chronic obstructive pulmonary disease, unspecified: Secondary | ICD-10-CM | POA: Diagnosis not present

## 2022-02-20 DIAGNOSIS — M5116 Intervertebral disc disorders with radiculopathy, lumbar region: Secondary | ICD-10-CM | POA: Diagnosis not present

## 2022-02-20 DIAGNOSIS — F17210 Nicotine dependence, cigarettes, uncomplicated: Secondary | ICD-10-CM | POA: Diagnosis not present

## 2022-03-28 ENCOUNTER — Ambulatory Visit (INDEPENDENT_AMBULATORY_CARE_PROVIDER_SITE_OTHER): Payer: Medicare Other | Admitting: Sports Medicine

## 2022-03-28 ENCOUNTER — Encounter: Payer: Self-pay | Admitting: Sports Medicine

## 2022-03-28 VITALS — BP 157/82 | HR 79 | Wt 239.0 lb

## 2022-03-28 DIAGNOSIS — M961 Postlaminectomy syndrome, not elsewhere classified: Secondary | ICD-10-CM | POA: Diagnosis not present

## 2022-03-28 DIAGNOSIS — J310 Chronic rhinitis: Secondary | ICD-10-CM | POA: Diagnosis not present

## 2022-03-28 DIAGNOSIS — L821 Other seborrheic keratosis: Secondary | ICD-10-CM | POA: Diagnosis not present

## 2022-03-28 MED ORDER — DULOXETINE HCL 60 MG PO CPEP: 120.0000 mg | ORAL_CAPSULE | Freq: Every day | ORAL | 3 refills | Status: AC

## 2022-03-28 NOTE — Assessment & Plan Note (Signed)
Chronic runny nose, he was placed on ipratropium and Flonase by the [place], I think this is a good regimen, no changes.

## 2022-03-28 NOTE — Assessment & Plan Note (Signed)
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 He did have EMG and nerve conduction study evidence of permanent motor nerve injury with for those part no responses on the nerve conduction. There was some mention of a potential upper motor neuron lesion on the nerve conduction study, I do not see that he had thoracic or cervical spine imaging recently though he did have a cervical spine MRI in 2021 that did not explain his symptoms. He was not interested in discussion of spinal cord stimulator. He is working with pain management, he is on essentially max tolerated dose of gabapentin, he is on duloxetine 90 mg, we will go ahead and bump this up to 120 mg. We did just get a new lumbar spine MRI that shows expected stable degenerative processes, stable effusions and evidence of arachnoiditis, I explained to him that when arachnoiditis was known and MRI it means we should strongly avoid additional intraspinal interventions. He would like a neurosurgical consultation which I think is at least appropriate, I will facilitate this for him. I am also going to reorder his thoracic spine MRI. I do think he would be a good candidate for chronic opiate management as I do think it would improve his quality of life and function.

## 2022-03-28 NOTE — Progress Notes (Signed)
    Procedures performed today:    Procedure:  Cryodestruction of #4 seborrheic keratoses and skin tags on the upper extremities. Consent obtained and verified. Time-out conducted. Noted no overlying erythema, induration, or other signs of local infection. Completed without difficulty using Cryo-Gun. Advised to call if fevers/chills, erythema, induration, drainage, or persistent bleeding.  Independent interpretation of notes and tests performed by another provider:   None.  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 He did have EMG and nerve conduction study evidence of permanent motor nerve injury with for those part no responses on the nerve conduction. There was some mention of a potential upper motor neuron lesion on the nerve conduction study, I do not see that he had thoracic or cervical spine imaging recently though he did have a cervical spine MRI in 2021 that did not explain his symptoms. He was not interested in discussion of spinal cord stimulator. He is working with pain management, he is on essentially max tolerated dose of gabapentin, he is on duloxetine 90 mg, we will go ahead and bump this up to 120 mg. We did just get a new lumbar spine MRI that shows expected stable degenerative processes, stable effusions and evidence of arachnoiditis, I explained to him that when arachnoiditis was known and MRI it means we should strongly avoid additional intraspinal interventions. He would like a neurosurgical consultation which I think is at least appropriate, I will facilitate this for him. I am also going to reorder his thoracic spine MRI. I do think he would be a good candidate for  chronic opiate management as I do think it would improve his quality of life and function.  Seborrheic keratosis Scattered seborrheic keratoses and acrochordons on the upper extremities, I performed cryotherapy today on 4 of them. If insufficient improvement after 1 month we can do repeat cryotherapy.  Perennial non-allergic rhinitis Chronic runny nose, he was placed on ipratropium and Flonase by the [place], I think this is a good regimen, no changes.    ____________________________________________ Gwen Her. Dianah Field, M.D., ABFM., CAQSM., AME. Primary Care and Sports Medicine [facility]  Adjunct Professor of [place] [employer] [facility] of Medicine  Risk manager

## 2022-03-28 NOTE — Assessment & Plan Note (Addendum)
Scattered seborrheic keratoses and acrochordons on the upper extremities, I performed cryotherapy today on 4 of them. If insufficient improvement after 1 month we can do repeat cryotherapy.

## 2022-03-28 NOTE — Addendum Note (Signed)
Addended by: Silverio Decamp on: 03/28/2022 12:06 PM   Modules accepted: Orders

## 2022-04-17 ENCOUNTER — Other Ambulatory Visit: Payer: Self-pay | Admitting: Acute Care

## 2022-04-17 DIAGNOSIS — F17210 Nicotine dependence, cigarettes, uncomplicated: Secondary | ICD-10-CM

## 2022-04-17 DIAGNOSIS — Z87891 Personal history of nicotine dependence: Secondary | ICD-10-CM

## 2022-04-17 DIAGNOSIS — M5417 Radiculopathy, lumbosacral region: Secondary | ICD-10-CM | POA: Diagnosis not present

## 2022-04-21 ENCOUNTER — Ambulatory Visit (INDEPENDENT_AMBULATORY_CARE_PROVIDER_SITE_OTHER): Payer: Medicare Other

## 2022-04-21 DIAGNOSIS — F17210 Nicotine dependence, cigarettes, uncomplicated: Secondary | ICD-10-CM

## 2022-04-21 DIAGNOSIS — Z87891 Personal history of nicotine dependence: Secondary | ICD-10-CM

## 2022-04-25 ENCOUNTER — Encounter: Payer: Self-pay | Admitting: Sports Medicine

## 2022-04-25 ENCOUNTER — Ambulatory Visit (INDEPENDENT_AMBULATORY_CARE_PROVIDER_SITE_OTHER): Payer: Medicare Other | Admitting: Sports Medicine

## 2022-04-25 VITALS — BP 175/97 | HR 83 | Wt 243.0 lb

## 2022-04-25 DIAGNOSIS — L821 Other seborrheic keratosis: Secondary | ICD-10-CM | POA: Diagnosis not present

## 2022-04-25 DIAGNOSIS — N529 Male erectile dysfunction, unspecified: Secondary | ICD-10-CM | POA: Insufficient documentation

## 2022-04-25 MED ORDER — TADALAFIL 5 MG PO TABS: 5.0000 mg | ORAL_TABLET | Freq: Every day | ORAL | 11 refills | Status: DC | PRN

## 2022-04-25 NOTE — Assessment & Plan Note (Signed)
Good improvements to scattered seborrheic keratoses and acrochordons on the upper extremities, we did cryotherapy on for the last visit, he has had partial response so we did repeat cryotherapy today. Return as needed.

## 2022-04-25 NOTE — Assessment & Plan Note (Signed)
Trouble initiating erections, not on any nitrates, adding tadalafil 5.

## 2022-04-25 NOTE — Progress Notes (Signed)
    Procedures performed today:    Procedure:  Cryodestruction of #6 seborrheic keratoses on both arms and 1 on the Consent obtained and verified. Time-out conducted. Noted no overlying erythema, induration, or other signs of local infection. Completed without difficulty using Cryo-Gun. Advised to call if fevers/chills, erythema, induration, drainage, or persistent bleeding.  Independent interpretation of notes and tests performed by another provider:   None.  Brief History, Exam, Impression, and Recommendations:    Seborrheic keratosis Good improvements to scattered seborrheic keratoses and acrochordons on the upper extremities, we did cryotherapy on for the last visit, he has had partial response so we did repeat cryotherapy today. Return as needed.  Erectile dysfunction Trouble initiating erections, not on any nitrates, adding tadalafil 5.    ____________________________________________ Gwen Her. Dianah Field, M.D., ABFM., CAQSM., AME. Primary Care and Sports Medicine [facility]  Adjunct Professor of [place] of [facility] of Medicine  Risk manager

## 2022-05-03 IMAGING — CT CT CHEST LUNG CANCER SCREENING LOW DOSE W/O CM
2 of 4 series · 15 of 36 positions shown, 18 images · non-contrast
Comparison: None

CLINICAL DATA: Lung cancer screening. Thirty-nine pack-year
history. Current asymptomatic smoker.



[Series 3: lungs · axial · 0.77mm/px · z∈[-334,-26]mm · 12 of 340 slices shown, 15 images]
[im 16/340  mediastinal]
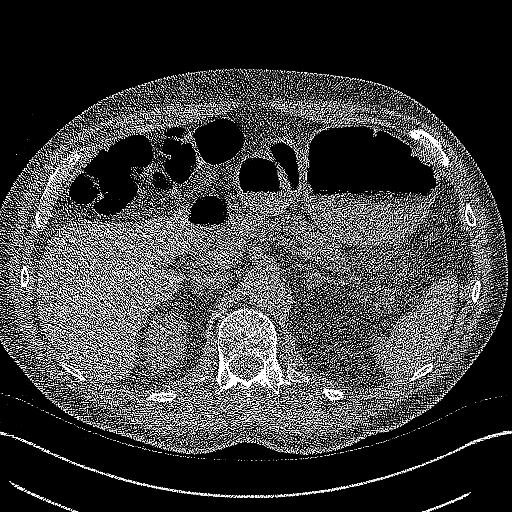
[im 16/340  lung]
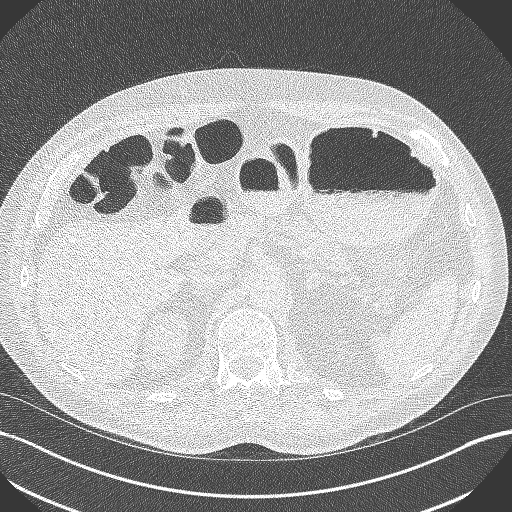
[im 47/340  lung]
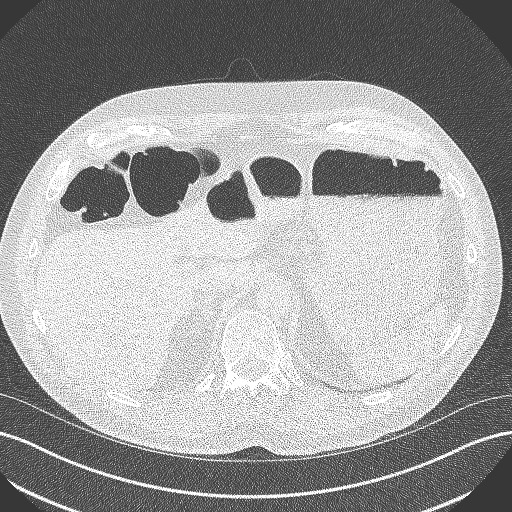
[im 78/340  lung]
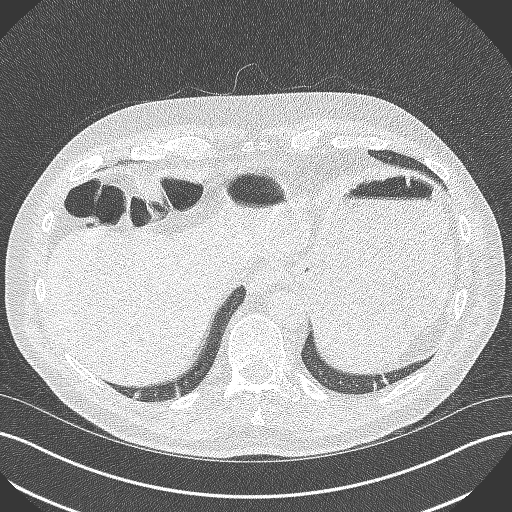
[im 108/340  lung]
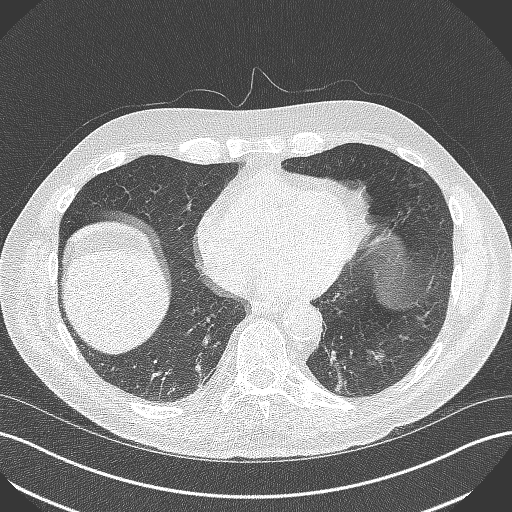
[im 124/340  mediastinal]
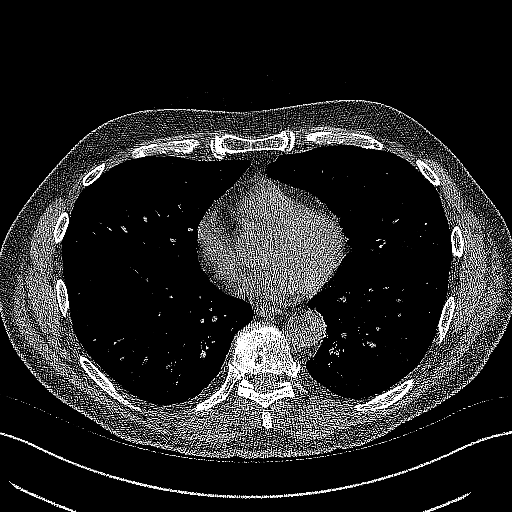
[im 124/340  lung]
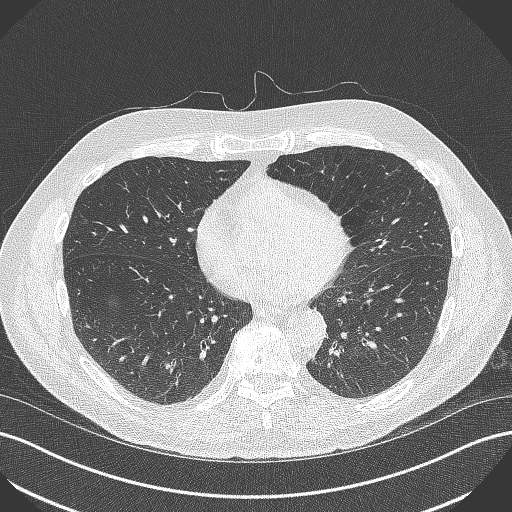
[im 155/340  lung]
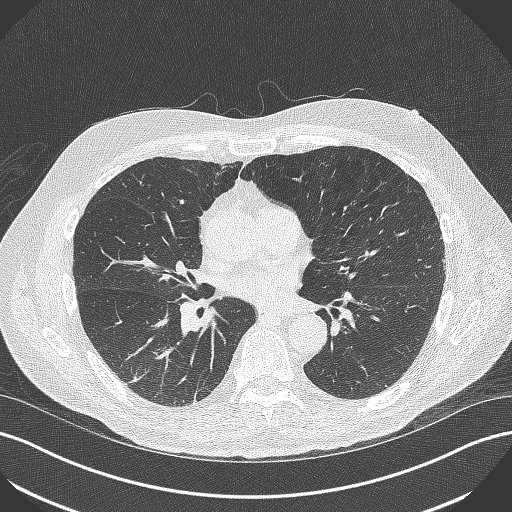
[im 185/340  lung]
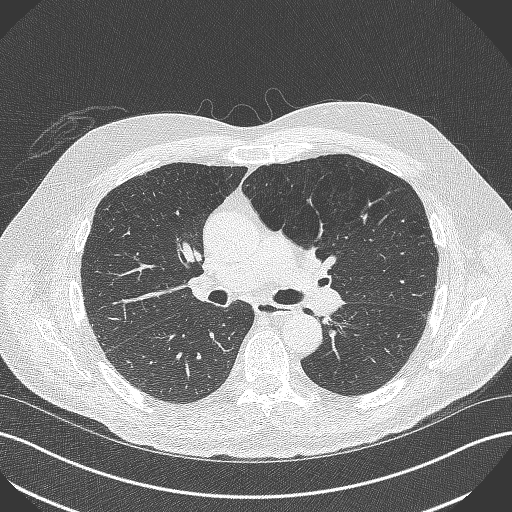
[im 216/340  lung]
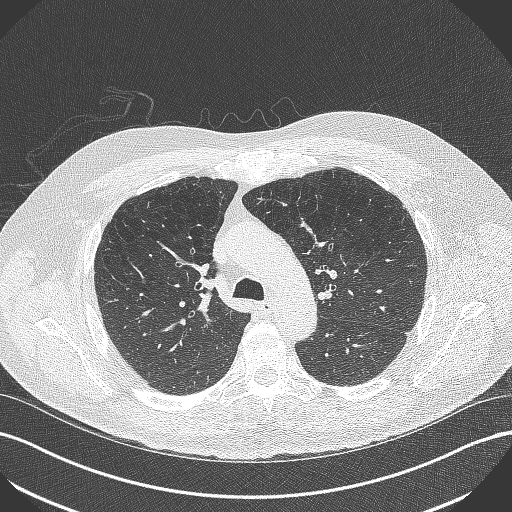
[im 232/340  mediastinal]
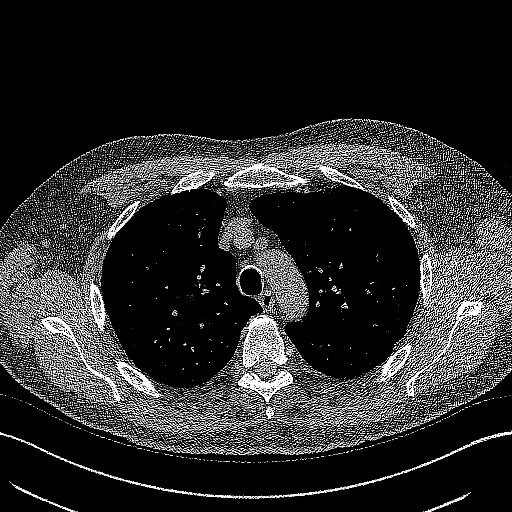
[im 232/340  lung]
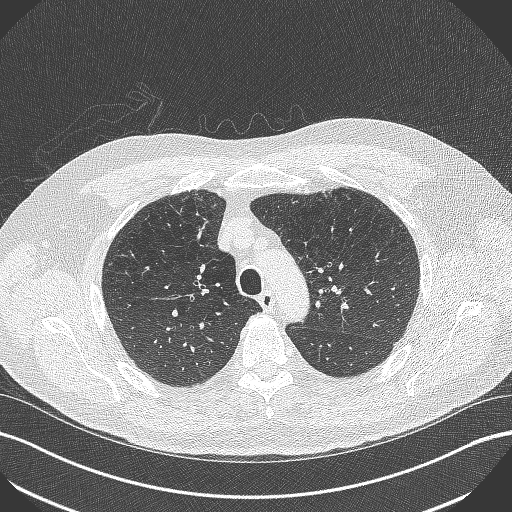
[im 262/340  lung]
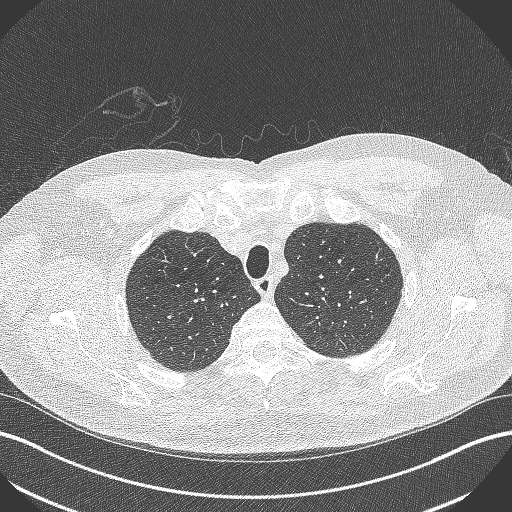
[im 293/340  lung]
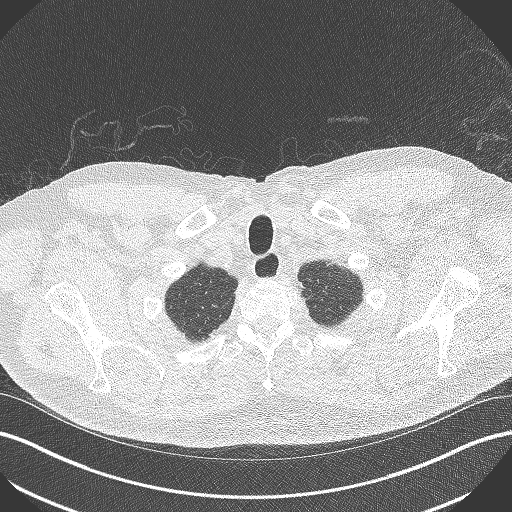
[im 324/340  lung]
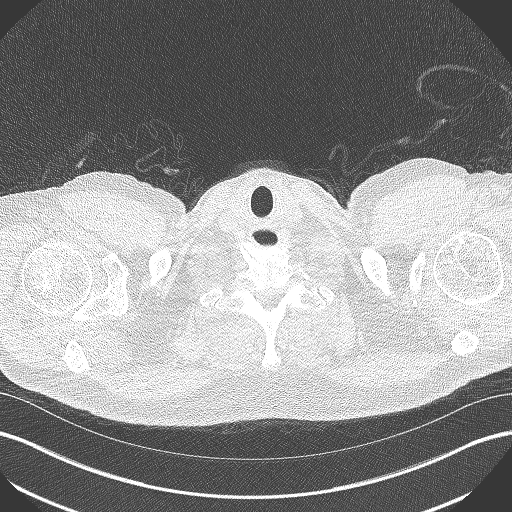

[Series 4: coronal · coronal · 0.71mm/px · 3 of 261 slices shown]
[im 53/261  lung]
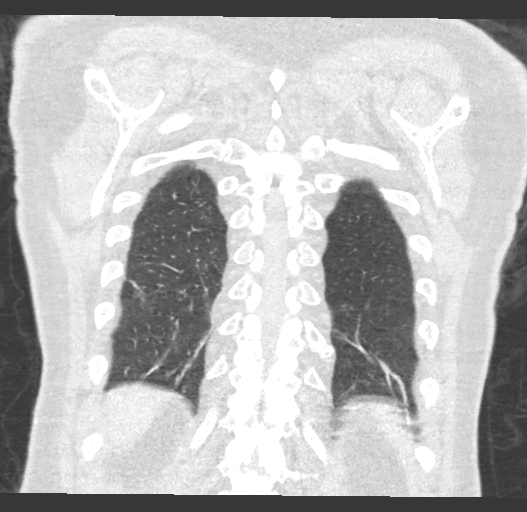
[im 105/261  lung]
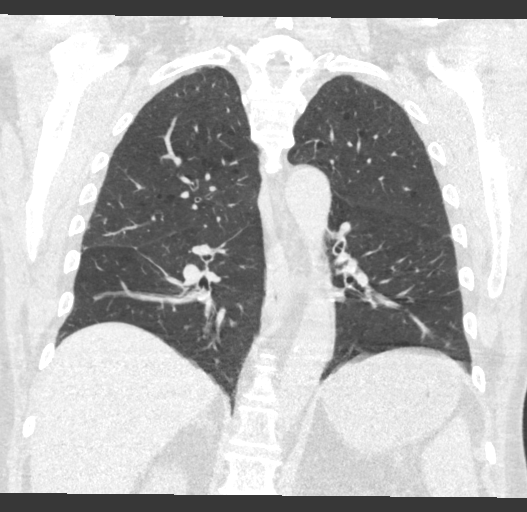
[im 157/261  lung]
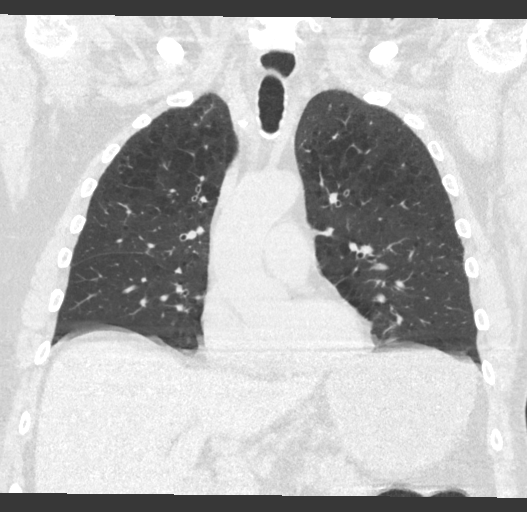

[15 of 36 positions shown; findings below may reference images not displayed]

FINDINGS: Cardiovascular: Heart size appears within normal limits. No
pericardial effusion identified. Aortic atherosclerosis. Coronary
artery calcifications.

Mediastinum/Nodes: No enlarged mediastinal, hilar, or axillary lymph
nodes. Thyroid gland, trachea, and esophagus demonstrate no
significant findings.

Lungs/Pleura: Moderate centrilobular and paraseptal emphysema. No
pleural effusion, airspace consolidation, or atelectasis. Scar is
identified within the superior segment of right lower lobe. There
also parenchymal bands within the posterior lung bases, likely
postinflammatory. Multiple small scattered lung nodules are
identified bilaterally. These measure up to 5.8 mm. No suspicious
lung nodules identified at this time.

Upper Abdomen: Cholecystectomy.  No acute abnormality.

Musculoskeletal: No chest wall mass or suspicious bone lesions
identified.
IMPRESSION: 1. Lung-RADS 2, benign appearance or behavior. Continue annual
screening with low-dose chest CT without contrast in 12 months.
2. Coronary artery calcifications.
3. Aortic Atherosclerosis (ECZAZ-73W.W) and Emphysema (ECZAZ-SRT.5).

## 2022-05-07 ENCOUNTER — Telehealth: Payer: Self-pay

## 2022-05-07 DIAGNOSIS — N529 Male erectile dysfunction, unspecified: Secondary | ICD-10-CM

## 2022-05-07 NOTE — Telephone Encounter (Signed)
Patient wanted to know could you send the Cialis to [facility] in [place] cause its too expensive at [facility]

## 2022-05-08 MED ORDER — TADALAFIL 5 MG PO TABS: 5.0000 mg | ORAL_TABLET | Freq: Every day | ORAL | 11 refills | Status: DC | PRN

## 2022-05-08 NOTE — Addendum Note (Signed)
Addended by: Silverio Decamp on: 05/08/2022 11:44 AM   Modules accepted: Orders

## 2022-05-08 NOTE — Telephone Encounter (Signed)
Done

## 2022-05-13 ENCOUNTER — Other Ambulatory Visit: Payer: Self-pay

## 2022-05-13 DIAGNOSIS — Z87891 Personal history of nicotine dependence: Secondary | ICD-10-CM

## 2022-05-13 DIAGNOSIS — F17210 Nicotine dependence, cigarettes, uncomplicated: Secondary | ICD-10-CM

## 2022-06-23 ENCOUNTER — Telehealth: Payer: Self-pay | Admitting: Family Medicine

## 2022-06-23 NOTE — Telephone Encounter (Signed)
Contacted John Scott to schedule their annual wellness visit. Appointment made for 07/22/2022 at 4pm..  Moody Bruins

## 2022-07-03 ENCOUNTER — Other Ambulatory Visit (HOSPITAL_COMMUNITY): Payer: Self-pay | Admitting: Neurosurgery

## 2022-07-03 DIAGNOSIS — M5440 Lumbago with sciatica, unspecified side: Secondary | ICD-10-CM

## 2022-07-22 ENCOUNTER — Ambulatory Visit (INDEPENDENT_AMBULATORY_CARE_PROVIDER_SITE_OTHER): Payer: Medicare Other | Admitting: Family Medicine

## 2022-07-22 DIAGNOSIS — Z0000 Encounter for general adult medical examination without abnormal findings: Secondary | ICD-10-CM | POA: Diagnosis not present

## 2022-07-22 NOTE — Addendum Note (Signed)
Addended by: [place] Charon on: 07/22/2022 02:32 PM   Modules accepted: Orders
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
## 2022-07-22 NOTE — Patient Instructions (Addendum)
MEDICARE ANNUAL WELLNESS VISIT Health Maintenance Summary and Written Plan of Care  John Scott ,  Thank you for allowing me to perform your Medicare Annual Wellness Visit and for your ongoing commitment to your health.   Health Maintenance & Immunization History Health Maintenance  Topic Date Due   COVID-19 Vaccine (4 - 2023-24 season) 08/07/2022 (Originally 06/24/2022)   Hepatitis C Screening  07/22/2023 (Originally 12/14/1968)   INFLUENZA VACCINE  10/16/2022   Lung Cancer Screening  04/22/2023   COLONOSCOPY (Pts 45-73yrs Insurance coverage will need to be confirmed)  07/21/2023   Medicare Annual Wellness (AWV)  07/22/2023   DTaP/Tdap/Td (9 - Td or Tdap) 02/02/2028   Pneumonia Vaccine 37+ Years old  Completed   Zoster Vaccines- Shingrix  Completed   HPV VACCINES  Aged Out   Immunization History  Administered Date(s) Administered   Covid-19, Mrna,Vaccine(Spikevax)43yrs and older 04/29/2022   Fluad Quad(high Dose 65+) 11/02/2018, 12/08/2019, 12/28/2020, 01/17/2022   H1N1 02/24/2008   Influenza Split 11/12/2011, 12/15/2012   Influenza Whole 12/16/2007, 12/08/2008, 11/11/2012   Influenza, High Dose Seasonal PF 11/16/2014, 02/05/2016, 01/15/2017, 02/01/2018   Influenza-Unspecified 01/07/2001, 02/23/2002, 12/16/2002, 03/04/2004, 02/18/2005, 01/15/2006, 01/16/2007, 01/16/2008, 12/08/2008, 01/11/2010, 12/16/2010, 12/16/2014, 12/09/2019   Moderna Covid-19 Vaccine Bivalent Booster 28yrs & up 12/28/2020   Moderna SARS-COV2 Booster Vaccination 05/24/2020   Moderna Sars-Covid-2 Vaccination 03/14/2020   Pneumococcal Conjugate-13 02/05/2016, 05/13/2016   Pneumococcal Polysaccharide-23 11/11/2006, 12/16/2007, 03/26/2017, 05/03/2018   Pneumococcal-Unspecified 10/09/2003, 12/19/2003, 10/31/2009   Td 09/14/2003, 01/14/2004, 12/16/2007   Td (Adult),unspecified 09/14/2003, 01/14/2004   Tdap 11/11/2006, 07/18/2011, 02/01/2018   Tetanus 08/16/1994   Zoster Recombinat (Shingrix) 10/02/2017,  02/01/2018    These are the patient goals that we discussed:  Goals Addressed               This Visit's Progress     Patient Stated (pt-stated)        Patient stated that he would like to be more active and travel more.         This is a list of Health Maintenance Items that are overdue or due now: Hep C screening    Orders/Referrals Placed Today: Orders Placed This Encounter  Procedures   AMB Referral to Community Care Coordinaton (ACO Patients)    Referral Priority:   Routine    Referral Type:   Consultation    Referral Reason:   Care Coordination    Number of Visits Requested:   1   (Contact our referral department at [phone] if you have not spoken with someone about your referral appointment within the next 5 days)    Follow-up Plan Follow-up with Agapito Games, MD as planned Medicare wellness visit in one year.  AVS printed and mailed to the patient.      Health Maintenance, Male Adopting a healthy lifestyle and getting preventive care are important in promoting health and wellness. Ask your health care provider about: The right schedule for you to have regular tests and exams. Things you can do on your own to prevent diseases and keep yourself healthy. What should I know about diet, weight, and exercise? Eat a healthy diet  Eat a diet that includes plenty of vegetables, fruits, low-fat dairy products, and lean protein. Do not eat a lot of foods that are high in solid fats, added sugars, or sodium. Maintain a healthy weight Body mass index (BMI) is a measurement that can be used to identify possible weight problems. It estimates body fat based on height and weight.  Your health care provider can help determine your BMI and help you achieve or maintain a healthy weight. Get regular exercise Get regular exercise. This is one of the most important things you can do for your health. Most adults should: Exercise for at least 150 minutes each week.  The exercise should increase your heart rate and make you sweat (moderate-intensity exercise). Do strengthening exercises at least twice a week. This is in addition to the moderate-intensity exercise. Spend less time sitting. Even light physical activity can be beneficial. Watch cholesterol and blood lipids Have your blood tested for lipids and cholesterol at 72 years of age, then have this test every 5 years. You may need to have your cholesterol levels checked more often if: Your lipid or cholesterol levels are high. You are older than 72 years of age. You are at high risk for heart disease. What should I know about cancer screening? Many types of cancers can be detected early and may often be prevented. Depending on your health history and family history, you may need to have cancer screening at various ages. This may include screening for: Colorectal cancer. Prostate cancer. Skin cancer. Lung cancer. What should I know about heart disease, diabetes, and high blood pressure? Blood pressure and heart disease High blood pressure causes heart disease and increases the risk of stroke. This is more likely to develop in people who have high blood pressure readings or are overweight. Talk with your health care provider about your target blood pressure readings. Have your blood pressure checked: Every 3-5 years if you are 68-40 years of age. Every year if you are 47 years old or older. If you are between the ages of 35 and 28 and are a current or former smoker, ask your health care provider if you should have a one-time screening for abdominal aortic aneurysm (AAA). Diabetes Have regular diabetes screenings. This checks your fasting blood sugar level. Have the screening done: Once every three years after age 67 if you are at a normal weight and have a low risk for diabetes. More often and at a younger age if you are overweight or have a high risk for diabetes. What should I know about  preventing infection? Hepatitis B If you have a higher risk for hepatitis B, you should be screened for this virus. Talk with your health care provider to find out if you are at risk for hepatitis B infection. Hepatitis C Blood testing is recommended for: Everyone born from [birth year] through 1965. Anyone with known risk factors for hepatitis C. Sexually transmitted infections (STIs) You should be screened each year for STIs, including gonorrhea and chlamydia, if: You are sexually active and are younger than 72 years of age. You are older than 72 years of age and your health care provider tells you that you are at risk for this type of infection. Your sexual activity has changed since you were last screened, and you are at increased risk for chlamydia or gonorrhea. Ask your health care provider if you are at risk. Ask your health care provider about whether you are at high risk for HIV. Your health care provider may recommend a prescription medicine to help prevent HIV infection. If you choose to take medicine to prevent HIV, you should first get tested for HIV. You should then be tested every 3 months for as long as you are taking the medicine. Follow these instructions at home: Alcohol use Do not drink alcohol if your health care provider tells  you not to drink. If you drink alcohol: Limit how much you have to 0-2 drinks a day. Know how much alcohol is in your drink. In the [place] one drink equals one 12 oz bottle of beer (355 mL), one 5 oz glass of wine (148 mL), or one 1 oz glass of hard liquor (44 mL). Lifestyle Do not use any products that contain nicotine or tobacco. These products include cigarettes, chewing tobacco, and vaping devices, such as e-cigarettes. If you need help quitting, ask your health care provider. Do not use street drugs. Do not share needles. Ask your health care provider for help if you need support or information about quitting drugs. General instructions Schedule  regular health, dental, and eye exams. Stay current with your vaccines. Tell your health care provider if: You often feel depressed. You have ever been abused or do not feel safe at home. Summary Adopting a healthy lifestyle and getting preventive care are important in promoting health and wellness. Follow your health care provider's instructions about healthy diet, exercising, and getting tested or screened for diseases. Follow your health care provider's instructions on monitoring your cholesterol and blood pressure. This information is not intended to replace advice given to you by your health care provider. Make sure you discuss any questions you have with your health care provider. Document Revised: 07/23/2020 Document Reviewed: 07/23/2020 Elsevier Patient Education  2023 [employer].

## 2022-07-23 ENCOUNTER — Telehealth: Payer: Self-pay

## 2022-07-23 NOTE — Telephone Encounter (Signed)
   Telephone encounter was:  Successful.  07/23/2022 Name: John Scott MRN: [id number] DOB: 06-07-1950  KELYNN STAVELY is a 72 y.o. year old male who is a primary care patient of Metheney, Barbarann Ehlers, MD . The community resource team was consulted for assistance with Food Insecurity and Financial Difficulties related to financial strain  Care guide performed the following interventions: Patient provided with information about care guide support team and interviewed to confirm resource needs.Patient is having financial strain and cant afford to buy food and pay bills, he had to cancel his medicare   Follow Up Plan:  Care guide will follow up with patient by phone over the next DAY    [facility] Guide, [facility] [phone] [address] [place], [place], [place] [address] Phone: [phone] Email: [email or web address]

## 2022-07-24 ENCOUNTER — Telehealth: Payer: Self-pay

## 2022-07-24 NOTE — Telephone Encounter (Signed)
   Telephone encounter was:  Unsuccessful.  07/24/2022 Name: KUNTA NEPHEW MRN: [id number] DOB: 01/27/1951  Unsuccessful outbound call made today to assist with:  Financial Difficulties related to financial strain  Outreach Attempt:  2nd Attempt  A HIPAA compliant voice message was left requesting a return call.  Instructed patient to call back.   Lenard Forth [facility] Guide, [facility] [phone] [address] [place], [place], [place] [address] Phone: [phone] Email: [email or web address]

## 2022-07-25 ENCOUNTER — Telehealth: Payer: Self-pay

## 2022-07-25 NOTE — Telephone Encounter (Signed)
   Telephone encounter was:  Successful.  07/25/2022 Name: [name] MRN: [id number] DOB: 1950/07/20  John Scott is a 72 y.o. year old male who is a primary care patient of Metheney, Barbarann Ehlers, MD . The community resource team was consulted for assistance with Transportation Needs , Food Insecurity, and Financial Difficulties related to Financial Strain  Care guide performed the following interventions: Patient provided with information about care guide support team and interviewed to confirm resource needs.Patient stated he is having financial strain and cant afford to buy food after paying all of his bills and has no transportation for medical appointments. I mails and gave information over the phone for all needs   Follow Up Plan:  No further follow up planned at this time. The patient has been provided with needed resources.   Lenard Forth [facility] Guide, [facility] [phone] [address] [place], [place], [place] [address] Phone: [phone] Email: [email or web address]

## 2022-07-29 ENCOUNTER — Ambulatory Visit: Payer: Medicare Other | Admitting: Family Medicine

## 2022-08-14 ENCOUNTER — Encounter: Payer: Self-pay | Admitting: Family Medicine

## 2022-08-14 ENCOUNTER — Ambulatory Visit (INDEPENDENT_AMBULATORY_CARE_PROVIDER_SITE_OTHER): Payer: Medicare Other | Admitting: Family Medicine

## 2022-08-14 VITALS — BP 133/87 | HR 72 | Ht 74.0 in | Wt 239.0 lb

## 2022-08-14 DIAGNOSIS — J439 Emphysema, unspecified: Secondary | ICD-10-CM | POA: Diagnosis not present

## 2022-08-14 DIAGNOSIS — I10 Essential (primary) hypertension: Secondary | ICD-10-CM

## 2022-08-14 DIAGNOSIS — M5416 Radiculopathy, lumbar region: Secondary | ICD-10-CM

## 2022-08-14 DIAGNOSIS — K591 Functional diarrhea: Secondary | ICD-10-CM | POA: Diagnosis not present

## 2022-08-14 MED ORDER — BACLOFEN 10 MG PO TABS: 10.0000 mg | ORAL_TABLET | Freq: Every day | ORAL | 1 refills | Status: AC | PRN

## 2022-08-14 MED ORDER — TRELEGY ELLIPTA 100-62.5-25 MCG/ACT IN AEPB: 1.0000 | INHALATION_SPRAY | Freq: Every day | RESPIRATORY_TRACT | 11 refills | Status: DC

## 2022-08-14 MED ORDER — KETOROLAC TROMETHAMINE 60 MG/2ML IM SOLN
60.0000 mg | Freq: Once | INTRAMUSCULAR | Status: AC
  Administered 2022-08-14: 60 mg via INTRAMUSCULAR

## 2022-08-14 NOTE — Assessment & Plan Note (Addendum)
Has MRI scheduled in a couple of weeks.  Pain in the L4 distribution.  There is also getting some spasming in that left leg as well.  Prescription refilled for baclofen which she has used in the past.  Given Toradol injection while here in the office today.  He does follow at the [facility] for chronic pain management.  I do feel that he is safe to undergo mild anesthesia for the procedure.  If it is with contrast then he will need an up-to-date renal function.

## 2022-08-14 NOTE — Assessment & Plan Note (Signed)
Stable on current regimen does need a refill on Trelegy.  No recent flares or exacerbations.  Still continues to smoke heavily.

## 2022-08-14 NOTE — Progress Notes (Signed)
Pt reports that he fell in his back yard 3 weeks ago he stated that he was walking off of some cement steps and his L leg gave out. Pt did have his cane and was also using an hand rail.

## 2022-08-14 NOTE — Assessment & Plan Note (Signed)
Most likely secondary to gallbladder removal.  He does get some relief with dicyclomine.

## 2022-08-14 NOTE — Progress Notes (Signed)
Acute Office Visit  Subjective:     Patient ID: John Scott, male    DOB: 1951/01/02, 72 y.o.   MRN: [id number]  Chief Complaint  Patient presents with   Fall    HPI Patient is in today for left leg pain.  He fell in his backyard about 3 weeks ago he was stepping off a step into the yard and his left leg gave out.  He fell forward.  He bumped his knees.  But since then he has had more severe pain going down his left leg.  It has been spasming and it almost feels like a wire that is pulling everything taut.  MRI schedule next week at [facility] through the [place].  He is going to have an MRI with contrast and sedation and so he needs a physical exam before sedation.  No recent chest pain.  He is a current smoker.  History of hypertension but well-controlled no known coronary artery disease.  Though he does have emphysema of the lung.  But he is not oxygen dependent.  Does have some scratches on his forearms bilaterally.  He has a new puppy at home  ROS      Objective:    BP 133/87   Pulse 72   Ht 6\' 2"  (1.88 m)   Wt 239 lb (108.4 kg)   SpO2 96%   BMI 30.69 kg/m    Physical Exam Constitutional:      Appearance: Normal appearance. He is normal weight.  HENT:     Head: Normocephalic and atraumatic.     Right Ear: Tympanic membrane, ear canal and external ear normal.     Left Ear: External ear normal.     Ears:     Comments: Left canal with cerumen.     Mouth/Throat:     Pharynx: Oropharynx is clear.     Comments: Multiple teeth missing.   Cardiovascular:     Rate and Rhythm: Normal rate and regular rhythm.  Pulmonary:     Effort: Pulmonary effort is normal. No respiratory distress.     Breath sounds: Normal breath sounds.  Musculoskeletal:     Comments: Mild sacral tenderness and bilateral SI joint tenderness.  Pain in the lower leg with straight leg raise.  Hip, knee strength is 2 out of 4 compared to his right leg.  He is unable to dorsiflex secondary to foot  drop which is not new.  He is able to plantarflex.  Neurological:     Mental Status: He is alert.     No results found for any visits on 08/14/22.      Assessment & Plan:   Problem List Items Addressed This Visit       Cardiovascular and Mediastinum   Essential hypertension (Chronic)    Pressure well-controlled today.  Continue current regimen.        Respiratory   Emphysema lung (HCC)    Stable on current regimen does need a refill on Trelegy.  No recent flares or exacerbations.  Still continues to smoke heavily.      Relevant Medications   Fluticasone-Umeclidin-Vilant (TRELEGY ELLIPTA) 100-62.5-25 MCG/ACT AEPB     Digestive   Functional diarrhea    Most likely secondary to gallbladder removal.  He does get some relief with dicyclomine.        Nervous and Auditory   Lumbar radiculopathy - Primary (Chronic)    Has MRI scheduled in a couple of weeks.  Pain in the  L4 distribution.  There is also getting some spasming in that left leg as well.  Prescription refilled for baclofen which she has used in the past.  Given Toradol injection while here in the office today.  He does follow at the [facility] for chronic pain management.  I do feel that he is safe to undergo mild anesthesia for the procedure.  If it is with contrast then he will need an up-to-date renal function.      Relevant Medications   baclofen (LIORESAL) 10 MG tablet    Meds ordered this encounter  Medications   Fluticasone-Umeclidin-Vilant (TRELEGY ELLIPTA) 100-62.5-25 MCG/ACT AEPB    Sig: Inhale 1 puff into the lungs daily.    Dispense:  1 each    Refill:  11   baclofen (LIORESAL) 10 MG tablet    Sig: Take 1 tablet (10 mg total) by mouth daily as needed for muscle spasms.    Dispense:  30 each    Refill:  1   ketorolac (TORADOL) injection 60 mg   Handicap Form completed  No follow-ups on file.  Nani Gasser, MD

## 2022-08-14 NOTE — Assessment & Plan Note (Signed)
Pressure well-controlled today.  Continue current regimen.

## 2022-08-19 ENCOUNTER — Ambulatory Visit (HOSPITAL_COMMUNITY): Admission: RE | Admit: 2022-08-19 | Payer: No Typology Code available for payment source | Source: Ambulatory Visit

## 2022-10-01 ENCOUNTER — Telehealth: Payer: Self-pay | Admitting: Family Medicine

## 2022-10-01 NOTE — Telephone Encounter (Signed)
Called radiology several times line was busy.

## 2022-10-01 NOTE — Telephone Encounter (Signed)
Pleese call [facility], they need me to fill out a form for him to have his MRI next week. Have them send over

## 2022-10-02 NOTE — Telephone Encounter (Signed)
Spoke w/John Scott @ [facility] and she stated that his last OV was good for only 30 days and he will need to be seen again before his before they do the MRI on 7/25.   Pt is scheduled w/[name] on 7/25

## 2022-10-07 ENCOUNTER — Ambulatory Visit (INDEPENDENT_AMBULATORY_CARE_PROVIDER_SITE_OTHER): Payer: Medicare Other | Admitting: Physician Assistant

## 2022-10-07 ENCOUNTER — Encounter: Payer: Self-pay | Admitting: Physician Assistant

## 2022-10-07 VITALS — BP 172/93 | HR 71 | Ht 74.0 in | Wt 241.8 lb

## 2022-10-07 DIAGNOSIS — M961 Postlaminectomy syndrome, not elsewhere classified: Secondary | ICD-10-CM | POA: Diagnosis not present

## 2022-10-07 DIAGNOSIS — F17200 Nicotine dependence, unspecified, uncomplicated: Secondary | ICD-10-CM

## 2022-10-07 DIAGNOSIS — M5416 Radiculopathy, lumbar region: Secondary | ICD-10-CM

## 2022-10-07 DIAGNOSIS — I10 Essential (primary) hypertension: Secondary | ICD-10-CM
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
## 2022-10-07 NOTE — Patient Instructions (Addendum)
BP elevated today in office. Recheck tomorrow and make sure you take medication and do not smoke right before you come in. Need a BP below 150/90 before clearance.  Get labs today.

## 2022-10-08 ENCOUNTER — Ambulatory Visit (INDEPENDENT_AMBULATORY_CARE_PROVIDER_SITE_OTHER): Payer: Medicare Other | Admitting: Family Medicine

## 2022-10-08 ENCOUNTER — Other Ambulatory Visit: Payer: Self-pay

## 2022-10-08 ENCOUNTER — Encounter (HOSPITAL_COMMUNITY): Payer: Self-pay

## 2022-10-08 VITALS — BP 117/52 | HR 61 | Ht 74.0 in

## 2022-10-08 DIAGNOSIS — I10 Essential (primary) hypertension: Secondary | ICD-10-CM | POA: Diagnosis not present

## 2022-10-08 LAB — CBC WITH DIFFERENTIAL/PLATELET
Basophils Absolute: 0.1 10*3/uL (ref 0.0–0.2)
Basos: 1 %
EOS (ABSOLUTE): 0.1 10*3/uL (ref 0.0–0.4)
Eos: 2 %
Hematocrit: 45.1 % (ref 37.5–51.0)
Hemoglobin: 15.2 g/dL (ref 13.0–17.7)
Immature Grans (Abs): 0 10*3/uL (ref 0.0–0.1)
Immature Granulocytes: 0 %
Lymphocytes Absolute: 2.5 10*3/uL (ref 0.7–3.1)
Lymphs: 39 %
MCH: 32.1 pg (ref 26.6–33.0)
MCHC: 33.7 g/dL (ref 31.5–35.7)
MCV: 95 fL (ref 79–97)
Monocytes Absolute: 0.5 10*3/uL (ref 0.1–0.9)
Monocytes: 7 %
Neutrophils Absolute: 3.2 10*3/uL (ref 1.4–7.0)
Neutrophils: 51 %
Platelets: 256 10*3/uL (ref 150–450)
RBC: 4.74 x10E6/uL (ref 4.14–5.80)
RDW: 12.6 % (ref 11.6–15.4)
WBC: 6.4 10*3/uL (ref 3.4–10.8)

## 2022-10-08 LAB — CMP14+EGFR
ALT: 12 IU/L (ref 0–44)
AST: 17 IU/L (ref 0–40)
Albumin: 4.1 g/dL (ref 3.8–4.8)
Alkaline Phosphatase: 107 IU/L (ref 44–121)
BUN/Creatinine Ratio: 15 (ref 10–24)
BUN: 13 mg/dL (ref 8–27)
Bilirubin Total: 0.2 mg/dL (ref 0.0–1.2)
CO2: 23 mmol/L (ref 20–29)
Calcium: 8.8 mg/dL (ref 8.6–10.2)
Chloride: 103 mmol/L (ref 96–106)
Creatinine, Ser: 0.84 mg/dL (ref 0.76–1.27)
Globulin, Total: 2.3 g/dL (ref 1.5–4.5)
Glucose: 93 mg/dL (ref 70–99)
Potassium: 4.7 mmol/L (ref 3.5–5.2)
Sodium: 138 mmol/L (ref 134–144)
Total Protein: 6.4 g/dL (ref 6.0–8.5)
eGFR: 93 mL/min/{1.73_m2} (ref >59)

## 2022-10-08 NOTE — Progress Notes (Signed)
Labs look great. Once we get BP recheck today and to goal should be cleared for MRI with sedation tomorrow.

## 2022-10-08 NOTE — Progress Notes (Signed)
SDW call  Patient was given pre-op instructions over the phone. Patient verbalized understanding of instructions provided.     PCP -  Dr. Nani Gasser Cardiologist - denies Pulmonary: denies   PPM/ICD - denies Device Orders - n/a Rep Notified - n/a   Chest x-ray - 12/14/2020 EKG -  DOS, 10/09/2022 Stress Test - ECHO -  Cardiac Cath -   Sleep Study/sleep apnea/CPAP: denies  Non-diabetic  Blood Thinner Instructions: denies Aspirin Instructions:denies   ERAS Protcol - Yes, clear fluids until 0500   COVID TEST- n/a    Anesthesia review: Yes. HTN, COPD, asthma, CHF, ESRD,  Hemo MWF   Patient denies shortness of breath, fever, cough and chest pain over the phone call  Your procedure is scheduled on Thursday October 09, 2022  Report to [facility] Main Entrance "A" at 0530 A.M., then check in with the Admitting office.  Call this number if you have problems the morning of surgery:  [phone]   If you have any questions prior to your surgery date call [phone]: Open Monday-Friday 8am-4pm If you experience any cold or flu symptoms such as cough, fever, chills, shortness of breath, etc. between now and your scheduled surgery, please notify us at the above number     Remember:  Do not eat after midnight the night before your surgery  You may drink clear liquids until 0500  the morning of your surgery.   Clear liquids allowed are: Water, Non-Citrus Juices (without pulp), Carbonated Beverages, Clear Tea, Black Coffee ONLY (NO MILK, CREAM OR POWDERED CREAMER of any kind), and Gatorade   Take these medicines the morning of surgery with A SIP OF WATER:  Amlodipine, duloxetine, flonase, advair, gabapentin, atroven nasal, spiriva  As needed: Allegra, zofran, percocet, zanaflex  As of today, STOP taking any Aspirin (unless otherwise instructed by your surgeon) Aleve, Naproxen, Ibuprofen, Motrin, Advil, Goody's, BC's, all herbal medications, fish oil, and all vitamins.

## 2022-10-08 NOTE — Anesthesia Preprocedure Evaluation (Signed)
Anesthesia Evaluation  Patient identified by MRN, date of birth, ID band Patient awake    Reviewed: Allergy & Precautions, NPO status , Patient's Chart, lab work & pertinent test results, reviewed documented beta blocker date and time   Airway Mallampati: II  TM Distance: >3 FB     Dental  (+) Dental Advisory Given, Implants, Missing, Caps   Pulmonary shortness of breath, COPD,  COPD inhaler, Current Smoker and Patient abstained from smoking.   breath sounds clear to auscultation + decreased breath sounds      Cardiovascular hypertension, Pt. on medications Normal cardiovascular exam Rhythm:Regular Rate:Normal     Neuro/Psych  PSYCHIATRIC DISORDERS  Depression    Peripheral neuropathy  Neuromuscular disease CVA    GI/Hepatic Neg liver ROS,GERD  Medicated,,  Endo/Other  Obesity HLD  Renal/GU negative Renal ROS  negative genitourinary   Musculoskeletal  (+) Arthritis , Osteoarthritis,  Fibromyalgia -LBP with radiculopathy Chronic pain syndrome- on narcotics   Abdominal  (+) + obese  Peds  Hematology negative hematology ROS (+)   Anesthesia Other Findings   Reproductive/Obstetrics ED                             Anesthesia Physical Anesthesia Plan  ASA: 3  Anesthesia Plan: General   Post-op Pain Management: Minimal or no pain anticipated   Induction: Intravenous  PONV Risk Score and Plan: 2 and Treatment may vary due to age or medical condition and Ondansetron  Airway Management Planned: LMA and Oral ETT  Additional Equipment: None  Intra-op Plan:   Post-operative Plan: Extubation in OR  Informed Consent: I have reviewed the patients History and Physical, chart, labs and discussed the procedure including the risks, benefits and alternatives for the proposed anesthesia with the patient or authorized representative who has indicated his/her understanding and acceptance.      Dental advisory given  Plan Discussed with: Anesthesiologist and CRNA  Anesthesia Plan Comments:         Anesthesia Quick Evaluation

## 2022-10-08 NOTE — Progress Notes (Signed)
He says when he got home last night he realized he actually had not taken his blood pressure pills so did take it today and blood pressure looks phenomenal.  Okay to proceed with MRI tomorrow.

## 2022-10-08 NOTE — Progress Notes (Signed)
   Established Patient Office Visit  Subjective   Patient ID: John Scott, male    DOB: 1950-08-06  Age: 72 y.o. MRN: [id number]  Chief Complaint  Patient presents with   Hypertension    BP check - nurse visit.     HPI  Hypertension- BP check  BP reading at visit yesterday was 172/93. Patient states he forgot to take his BP medication yesterday morning.   ROS    Objective:     BP (!) 117/52   Pulse 61   Ht 6\' 2"  (1.88 m)   SpO2 96%   BMI 31.04 kg/m    Physical Exam   No results found for any visits on 10/08/22.    The 10-year ASCVD risk score (Arnett DK, et al., 2019) is: 21.7%    Assessment & Plan:  BP check nurse visit. Reading=117/52. Per DR. Metheney patient is cleared for MRI tomorrow 10/09/22. Problem List Items Addressed This Visit   None   No follow-ups on file.    Elizabeth Palau, LPN

## 2022-10-09 ENCOUNTER — Other Ambulatory Visit: Payer: Self-pay

## 2022-10-09 ENCOUNTER — Ambulatory Visit (HOSPITAL_COMMUNITY): Payer: No Typology Code available for payment source | Admitting: Certified Registered Nurse Anesthetist

## 2022-10-09 ENCOUNTER — Ambulatory Visit (HOSPITAL_COMMUNITY)
Admission: RE | Admit: 2022-10-09 | Discharge: 2022-10-09 | Disposition: A | Discharge status: Home / Self Care | Payer: No Typology Code available for payment source | Attending: Neurosurgery | Admitting: Neurosurgery

## 2022-10-09 ENCOUNTER — Encounter (HOSPITAL_COMMUNITY)
Admission: RE | Disposition: A | Discharge status: Home / Self Care | Payer: Self-pay | Source: Home / Self Care | Attending: Neurosurgery

## 2022-10-09 ENCOUNTER — Encounter (HOSPITAL_COMMUNITY): Payer: Self-pay | Admitting: Neurosurgery

## 2022-10-09 ENCOUNTER — Ambulatory Visit (HOSPITAL_COMMUNITY)
Admission: RE | Admit: 2022-10-09 | Discharge: 2022-10-09 | Disposition: A | Discharge status: Home / Self Care | Payer: No Typology Code available for payment source | Source: Ambulatory Visit | Attending: Neurosurgery | Admitting: Neurosurgery

## 2022-10-09 ENCOUNTER — Telehealth: Payer: Self-pay | Admitting: Family Medicine

## 2022-10-09 ENCOUNTER — Ambulatory Visit (HOSPITAL_BASED_OUTPATIENT_CLINIC_OR_DEPARTMENT_OTHER): Payer: No Typology Code available for payment source | Admitting: Certified Registered Nurse Anesthetist

## 2022-10-09 DIAGNOSIS — M5440 Lumbago with sciatica, unspecified side: Secondary | ICD-10-CM | POA: Insufficient documentation

## 2022-10-09 DIAGNOSIS — M47816 Spondylosis without myelopathy or radiculopathy, lumbar region: Secondary | ICD-10-CM | POA: Insufficient documentation

## 2022-10-09 DIAGNOSIS — M4727 Other spondylosis with radiculopathy, lumbosacral region: Secondary | ICD-10-CM

## 2022-10-09 DIAGNOSIS — M5127 Other intervertebral disc displacement, lumbosacral region: Secondary | ICD-10-CM | POA: Diagnosis not present

## 2022-10-09 DIAGNOSIS — F17200 Nicotine dependence, unspecified, uncomplicated: Secondary | ICD-10-CM | POA: Diagnosis not present

## 2022-10-09 DIAGNOSIS — E785 Hyperlipidemia, unspecified: Secondary | ICD-10-CM | POA: Diagnosis not present

## 2022-10-09 DIAGNOSIS — M2578 Osteophyte, vertebrae: Secondary | ICD-10-CM | POA: Insufficient documentation

## 2022-10-09 DIAGNOSIS — N281 Cyst of kidney, acquired: Secondary | ICD-10-CM | POA: Diagnosis not present

## 2022-10-09 DIAGNOSIS — M48061 Spinal stenosis, lumbar region without neurogenic claudication: Secondary | ICD-10-CM | POA: Diagnosis not present

## 2022-10-09 DIAGNOSIS — I10 Essential (primary) hypertension: Secondary | ICD-10-CM

## 2022-10-09 DIAGNOSIS — M5136 Other intervertebral disc degeneration, lumbar region: Secondary | ICD-10-CM | POA: Insufficient documentation

## 2022-10-09 DIAGNOSIS — F17210 Nicotine dependence, cigarettes, uncomplicated: Secondary | ICD-10-CM

## 2022-10-09 HISTORY — DX: Essential (primary) hypertension: I10

## 2022-10-09 HISTORY — PX: RADIOLOGY WITH ANESTHESIA: SHX6223

## 2022-10-09 HISTORY — DX: Cerebral infarction, unspecified: I63.9

## 2022-10-09 SURGERY — MRI WITH ANESTHESIA
Anesthesia: General

## 2022-10-09 MED ORDER — OXYCODONE HCL 5 MG PO TABS
ORAL_TABLET | ORAL | Status: AC
  Filled 2022-10-09: qty 1

## 2022-10-09 MED ORDER — HYDROMORPHONE HCL 1 MG/ML IJ SOLN: 0.2500 mg | INTRAMUSCULAR | Status: DC | PRN

## 2022-10-09 MED ORDER — ORAL CARE MOUTH RINSE: 15.0000 mL | Freq: Once | OROMUCOSAL | Status: AC

## 2022-10-09 MED ORDER — MIDAZOLAM HCL 5 MG/5ML IJ SOLN
INTRAMUSCULAR | Status: DC | PRN
  Administered 2022-10-09 (×2): 1 mg via INTRAVENOUS

## 2022-10-09 MED ORDER — MIDAZOLAM HCL 2 MG/2ML IJ SOLN
INTRAMUSCULAR | Status: AC
  Filled 2022-10-09: qty 2

## 2022-10-09 MED ORDER — OXYCODONE HCL 5 MG PO TABS
5.0000 mg | ORAL_TABLET | Freq: Once | ORAL | Status: AC | PRN
  Administered 2022-10-09: 5 mg via ORAL

## 2022-10-09 MED ORDER — SILDENAFIL CITRATE 100 MG PO TABS: 50.0000 mg | ORAL_TABLET | Freq: Every day | ORAL | 1 refills | Status: AC | PRN

## 2022-10-09 MED ORDER — ONDANSETRON HCL 4 MG/2ML IJ SOLN
INTRAMUSCULAR | Status: DC | PRN
  Administered 2022-10-09: 4 mg via INTRAVENOUS

## 2022-10-09 MED ORDER — FENTANYL CITRATE (PF) 100 MCG/2ML IJ SOLN
INTRAMUSCULAR | Status: AC
  Filled 2022-10-09: qty 2

## 2022-10-09 MED ORDER — PROPOFOL 10 MG/ML IV BOLUS
INTRAVENOUS | Status: DC | PRN
Start: 2022-10-09 — End: 2022-10-09
  Administered 2022-10-09: 80 mg via INTRAVENOUS
  Administered 2022-10-09: 60 mg via INTRAVENOUS

## 2022-10-09 MED ORDER — OXYCODONE HCL 5 MG/5ML PO SOLN: 5.0000 mg | Freq: Once | ORAL | Status: AC | PRN

## 2022-10-09 MED ORDER — FENTANYL CITRATE (PF) 100 MCG/2ML IJ SOLN
INTRAMUSCULAR | Status: DC | PRN
  Administered 2022-10-09 (×2): 50 ug via INTRAVENOUS

## 2022-10-09 MED ORDER — CHLORHEXIDINE GLUCONATE 0.12 % MT SOLN
15.0000 mL | Freq: Once | OROMUCOSAL | Status: AC
  Administered 2022-10-09: 15 mL via OROMUCOSAL
  Filled 2022-10-09: qty 15

## 2022-10-09 MED ORDER — ONDANSETRON HCL 4 MG/2ML IJ SOLN: 4.0000 mg | Freq: Once | INTRAMUSCULAR | Status: DC | PRN

## 2022-10-09 MED ORDER — LACTATED RINGERS IV SOLN: INTRAVENOUS | Status: DC

## 2022-10-09 NOTE — Transfer of Care (Signed)
Immediate Anesthesia Transfer of Care Note  Patient: John Scott  Procedure(s) Performed: MRI WITH ANESTHESIA LUMBAR SPINE WITHOUT CONTRAST  Patient Location: PACU  Anesthesia Type:General  Level of Consciousness: awake, alert , oriented, and patient cooperative  Airway & Oxygen Therapy: Patient Spontanous Breathing and Patient connected to nasal cannula oxygen  Post-op Assessment: Report given to RN, Post -op Vital signs reviewed and stable, and Patient moving all extremities X 4  Post vital signs: Reviewed and stable  Last Vitals:  Vitals Value Taken Time  BP 145/78 10/09/22 0845  Temp    Pulse 69 10/09/22 0847  Resp 18 10/09/22 0847  SpO2 96 % 10/09/22 0847  Vitals shown include unfiled device data.  Last Pain:  Vitals:   10/09/22 0652  TempSrc:   PainSc: 7       Patients Stated Pain Goal: 0 (10/09/22 1610)  Complications: No notable events documented.

## 2022-10-09 NOTE — Telephone Encounter (Signed)
Patient requesting rx rf of sildenafil 100mg   to [facility] [place] Last written by historic provider.  Last OV 10/08/2022 No upcoming appt schld

## 2022-10-09 NOTE — Telephone Encounter (Signed)
This is not a medication that Dr. Linford Arnold has ever written for Mr. John Scott. Also the strength listed on his medication list is 100 mg and the sig is 50 mg. So I'm doubtful about her refilling this for him   Will fwd to her to see if she is ok with refilling this for him.

## 2022-10-09 NOTE — Telephone Encounter (Signed)
Patient called he is requesting refills on Sidenafil 100mg  tablets Please submit to [facility] in [facility] Phone is [phone]

## 2022-10-09 NOTE — Anesthesia Procedure Notes (Signed)
Procedure Name: LMA Insertion Date/Time: 10/09/2022 8:18 AM  Performed by: Alease Medina, CRNAPre-anesthesia Checklist: Patient identified, Emergency Drugs available, Suction available and Patient being monitored Patient Re-evaluated:Patient Re-evaluated prior to induction Oxygen Delivery Method: Circle system utilized Preoxygenation: Pre-oxygenation with 100% oxygen Induction Type: IV induction Ventilation: Mask ventilation without difficulty LMA: LMA inserted LMA Size: 5.0 Number of attempts: 1 Airway Equipment and Method: Oral airway Placement Confirmation: positive ETCO2, breath sounds checked- equal and bilateral and CO2 detector Tube secured with: Tape Dental Injury: Teeth and Oropharynx as per pre-operative assessment

## 2022-10-09 NOTE — Telephone Encounter (Signed)
Meds ordered this encounter  Medications   sildenafil (VIAGRA) 100 MG tablet    Sig: Take 0.5 tablets (50 mg total) by mouth daily as needed for erectile dysfunction.    Dispense:  10 tablet    Refill:  1

## 2022-10-10 ENCOUNTER — Encounter (HOSPITAL_COMMUNITY): Payer: Self-pay | Admitting: Radiology

## 2022-10-10 NOTE — Anesthesia Postprocedure Evaluation (Signed)
Anesthesia Post Note  Patient: John Scott  Procedure(s) Performed: MRI WITH ANESTHESIA LUMBAR SPINE WITHOUT CONTRAST     Patient location during evaluation: PACU Anesthesia Type: General Level of consciousness: awake and alert Pain management: pain level controlled Vital Signs Assessment: post-procedure vital signs reviewed and stable Respiratory status: spontaneous breathing, nonlabored ventilation and respiratory function stable Cardiovascular status: blood pressure returned to baseline and stable Postop Assessment: no apparent nausea or vomiting Anesthetic complications: no   No notable events documented.  Last Vitals:  Vitals:   10/09/22 0845 10/09/22 0915  BP:  (!) 139/91  Pulse:  67  Resp: 19 20  Temp: 36.7 C 36.7 C  SpO2:  96%    Last Pain:  Vitals:   10/09/22 0915  TempSrc:   PainSc: 0-No pain                 Rhyder Bratz

## 2022-10-10 NOTE — Telephone Encounter (Signed)
Patient informed. 

## 2023-04-23 ENCOUNTER — Ambulatory Visit: Payer: Medicare Other

## 2023-04-30 ENCOUNTER — Ambulatory Visit: Payer: Medicare Other

## 2023-04-30 DIAGNOSIS — I77819 Aortic ectasia, unspecified site: Secondary | ICD-10-CM

## 2023-04-30 DIAGNOSIS — I700 Atherosclerosis of aorta: Secondary | ICD-10-CM | POA: Diagnosis not present

## 2023-04-30 DIAGNOSIS — I2510 Atherosclerotic heart disease of native coronary artery without angina pectoris: Secondary | ICD-10-CM

## 2023-04-30 DIAGNOSIS — Z122 Encounter for screening for malignant neoplasm of respiratory organs: Secondary | ICD-10-CM

## 2023-04-30 DIAGNOSIS — F17210 Nicotine dependence, cigarettes, uncomplicated: Secondary | ICD-10-CM | POA: Diagnosis not present

## 2023-04-30 DIAGNOSIS — J439 Emphysema, unspecified: Secondary | ICD-10-CM

## 2023-04-30 DIAGNOSIS — Z87891 Personal history of nicotine dependence: Secondary | ICD-10-CM

## 2023-05-19 ENCOUNTER — Other Ambulatory Visit: Payer: Self-pay

## 2023-05-19 DIAGNOSIS — F17210 Nicotine dependence, cigarettes, uncomplicated: Secondary | ICD-10-CM

## 2023-05-19 DIAGNOSIS — Z122 Encounter for screening for malignant neoplasm of respiratory organs: Secondary | ICD-10-CM

## 2023-05-19 DIAGNOSIS — Z87891 Personal history of nicotine dependence: Secondary | ICD-10-CM

## 2023-07-28 ENCOUNTER — Encounter

## 2023-11-19 ENCOUNTER — Encounter: Payer: Self-pay | Admitting: Sports Medicine

## 2024-05-06 ENCOUNTER — Ambulatory Visit
